# Patient Record
Sex: Female | Born: 1960 | Race: White | Hispanic: No | Smoking: Never smoker
Health system: Southern US, Community
[De-identification: ages and names within clinical notes are randomized; demographics above are authoritative.]

## PROBLEM LIST (undated history)

## (undated) DIAGNOSIS — Z8742 Personal history of other diseases of the female genital tract: Secondary | ICD-10-CM

## (undated) DIAGNOSIS — N946 Dysmenorrhea, unspecified: Secondary | ICD-10-CM

## (undated) DIAGNOSIS — J309 Allergic rhinitis, unspecified: Secondary | ICD-10-CM

## (undated) DIAGNOSIS — R002 Palpitations: Secondary | ICD-10-CM

## (undated) DIAGNOSIS — I10 Essential (primary) hypertension: Secondary | ICD-10-CM

## (undated) DIAGNOSIS — N949 Unspecified condition associated with female genital organs and menstrual cycle: Secondary | ICD-10-CM

## (undated) DIAGNOSIS — D259 Leiomyoma of uterus, unspecified: Secondary | ICD-10-CM

## (undated) DIAGNOSIS — N920 Excessive and frequent menstruation with regular cycle: Secondary | ICD-10-CM

## (undated) DIAGNOSIS — G40909 Epilepsy, unspecified, not intractable, without status epilepticus: Secondary | ICD-10-CM

## (undated) DIAGNOSIS — G43909 Migraine, unspecified, not intractable, without status migrainosus: Secondary | ICD-10-CM

## (undated) HISTORY — PX: SKIN CANCER EXCISION: SHX779

## (undated) HISTORY — PX: HAND SURGERY: SHX662

## (undated) HISTORY — DX: Migraine, unspecified, not intractable, without status migrainosus: G43.909

## (undated) HISTORY — DX: Leiomyoma of uterus, unspecified: D25.9

## (undated) HISTORY — DX: Personal history of other diseases of the female genital tract: Z87.42

## (undated) HISTORY — DX: Epilepsy, unspecified, not intractable, without status epilepticus: G40.909

## (undated) HISTORY — DX: Allergic rhinitis, unspecified: J30.9

## (undated) HISTORY — DX: Palpitations: R00.2

## (undated) HISTORY — DX: Excessive and frequent menstruation with regular cycle: N92.0

## (undated) HISTORY — PX: NO PAST SURGERIES: SHX2092

## (undated) HISTORY — DX: Unspecified condition associated with female genital organs and menstrual cycle: N94.9

## (undated) HISTORY — DX: Essential (primary) hypertension: I10

## (undated) HISTORY — DX: Dysmenorrhea, unspecified: N94.6

---

## 2005-06-17 ENCOUNTER — Ambulatory Visit: Payer: Self-pay | Admitting: Obstetrics and Gynecology

## 2006-08-04 ENCOUNTER — Ambulatory Visit: Payer: Self-pay | Admitting: Obstetrics and Gynecology

## 2007-08-27 ENCOUNTER — Ambulatory Visit: Payer: Self-pay | Admitting: Obstetrics and Gynecology

## 2008-09-20 ENCOUNTER — Ambulatory Visit: Payer: Self-pay | Admitting: Obstetrics and Gynecology

## 2009-09-27 ENCOUNTER — Ambulatory Visit: Payer: Self-pay | Admitting: Obstetrics and Gynecology

## 2011-02-14 ENCOUNTER — Ambulatory Visit: Payer: Self-pay | Admitting: Obstetrics and Gynecology

## 2012-02-28 ENCOUNTER — Ambulatory Visit: Payer: Self-pay | Admitting: Obstetrics and Gynecology

## 2013-03-05 ENCOUNTER — Ambulatory Visit: Payer: Self-pay | Admitting: Cardiovascular Disease

## 2013-03-24 ENCOUNTER — Ambulatory Visit (INDEPENDENT_AMBULATORY_CARE_PROVIDER_SITE_OTHER): Payer: PRIVATE HEALTH INSURANCE | Admitting: Cardiovascular Disease

## 2013-03-24 ENCOUNTER — Encounter: Payer: Self-pay | Admitting: Cardiovascular Disease

## 2013-03-24 VITALS — BP 153/103 | HR 81 | Ht 67.0 in | Wt 168.0 lb

## 2013-03-24 DIAGNOSIS — R0602 Shortness of breath: Secondary | ICD-10-CM

## 2013-03-24 DIAGNOSIS — Z8249 Family history of ischemic heart disease and other diseases of the circulatory system: Secondary | ICD-10-CM

## 2013-03-24 DIAGNOSIS — R079 Chest pain, unspecified: Secondary | ICD-10-CM

## 2013-03-24 DIAGNOSIS — E785 Hyperlipidemia, unspecified: Secondary | ICD-10-CM | POA: Insufficient documentation

## 2013-03-24 NOTE — Assessment & Plan Note (Signed)
Etiology of her episode of chest pain 2 months ago is not clear. EKG is normal. We have offered stress testing including routine treadmill, stress echo, or nuclear stress test. As she feels well and is working out at the gym with no significant symptoms, she would like to hold off on any further testing at this time.  Given her strong family history, she is willing to complete a calcium score which may help direct management of her lipids.

## 2013-03-24 NOTE — Assessment & Plan Note (Signed)
We've asked her to monitor her shortness of breath closely. Certainly this could be from recent weight gain. If symptoms get worse, we've asked her to call our office for further evaluation.

## 2013-03-24 NOTE — Patient Instructions (Addendum)
You are doing well. No medication changes were made.  We will schedule a calcium score in [place]  We will call you with the result  Please call us if you have new issues that need to be addressed before your next appt.

## 2013-03-24 NOTE — Assessment & Plan Note (Addendum)
Father was a long-time smoker with coronary artery disease at an early age, in his 10s

## 2013-03-24 NOTE — Assessment & Plan Note (Signed)
We will try to obtain her most recent lipid panel for our records. She will complete a calcium score/ noncontrast CT of the heart. If her score is elevated, we would treat her aggressively. No other risk factors.

## 2013-03-24 NOTE — Progress Notes (Signed)
Patient ID: Charlotte Watkins, female    DOB: [birth year], 52 y.o.   MRN: [id number]  HPI Comments: 52 ypo woman with PMH of palpitations, HTN, chest pain, migraines, presenting for new patient evaluation for chest pain.  She reports that 2 months ago, she was working out at [employer] when she developed shortness of breath, jaw pain, numbness down her arms. Symptoms lasted for several hours. She went home and lay down on the floor at home for quite some time and eventually symptoms resolved. She was very scared, looked up symptoms for heart attack on the Internet. Since then she has not had any further symptoms apart from some mild shortness of breath with exertion. She is uncertain if this is from being sedentary and recent weight gain. She has been back to the gym and has been working out with no reproducible chest pain. Overall now she feels close to her baseline apart from mild shortness of breath at times.  She has been seen before at [facility] for chest pain though reports nothing has been done for her symptoms.  She was told that her symptoms were from weight gain and PVCs. Holter monitor has been done in the past but is not available for [place] today. She denies ever having other cardiac testing such as echocardiogram or stress test.  Her biggest fear is family history. Father was a long-time smoker, had MI in his late 72s, bypass surgery, eventually required heart transplant She reports latest cholesterol was done by Dr. Greggory Keen, GYN  EKG today shows normal sinus rhythm with rate 77 beats per minute, poor R-wave progression through the anterior precordial leads   Outpatient Encounter Prescriptions as of 03/24/2013  Medication Sig Dispense Refill  . Calcium Carbonate-Vitamin D (CALCIUM + D PO) Take by mouth daily.      Marland Kitchen eletriptan (RELPAX) 40 MG tablet One tablet by mouth at onset of headache. May repeat in 2 hours if headache persists or recurs. may repeat in 2 hours if necessary       . Multiple Vitamin (MULTIVITAMIN) tablet Take 1 tablet by mouth daily.         Review of Systems  Constitutional: Negative.   HENT: Negative.   Eyes: Negative.   Respiratory: Positive for shortness of breath.   Cardiovascular: Positive for chest pain.  Gastrointestinal: Negative.   Musculoskeletal: Negative.   Skin: Negative.   Neurological: Negative.   Psychiatric/Behavioral: Negative.   All other systems reviewed and are negative.     BP 153/103  Pulse 81  Ht 5\' 7"  (1.702 m)  Wt 168 lb (76.204 kg)  BMI 26.31 kg/m2  Physical Exam  Nursing note and vitals reviewed. Constitutional: She is oriented to person, place, and time. She appears well-developed and well-nourished.  HENT:  Head: Normocephalic.  Nose: Nose normal.  Mouth/Throat: Oropharynx is clear and moist.  Eyes: Conjunctivae are normal. Pupils are equal, round, and reactive to light.  Neck: Normal range of motion. Neck supple. No JVD present.  Cardiovascular: Normal rate, regular rhythm, S1 normal, S2 normal, normal heart sounds and intact distal pulses.  Exam reveals no gallop and no friction rub.   No murmur heard. Pulmonary/Chest: Effort normal and breath sounds normal. No respiratory distress. She has no wheezes. She has no rales. She exhibits no tenderness.  Abdominal: Soft. Bowel sounds are normal. She exhibits no distension. There is no tenderness.  Musculoskeletal: Normal range of motion. She exhibits no edema and no tenderness.  Lymphadenopathy:  She has no cervical adenopathy.  Neurological: She is alert and oriented to person, place, and time. Coordination normal.  Skin: Skin is warm and dry. No rash noted. No erythema.  Psychiatric: She has a normal mood and affect. Her behavior is normal. Judgment and thought content normal.    Assessment and Plan

## 2013-03-25 ENCOUNTER — Telehealth: Payer: Self-pay

## 2013-03-25 NOTE — Telephone Encounter (Signed)
Scheduled pt for CT calcium score in [place] office 6/23 at 1030 Pt informed She is aware she needs to pay $150 at time of appt

## 2013-03-25 NOTE — Addendum Note (Signed)
Addended by: Sabino Snipes E on: 03/25/2013 08:57 AM   Modules accepted: Orders

## 2013-03-29 ENCOUNTER — Ambulatory Visit (INDEPENDENT_AMBULATORY_CARE_PROVIDER_SITE_OTHER)
Admission: RE | Admit: 2013-03-29 | Discharge: 2013-03-29 | Disposition: A | Discharge status: Home / Self Care | Payer: PRIVATE HEALTH INSURANCE | Source: Ambulatory Visit | Attending: Cardiovascular Disease | Admitting: Cardiovascular Disease

## 2013-03-29 DIAGNOSIS — R0602 Shortness of breath: Secondary | ICD-10-CM

## 2013-03-29 DIAGNOSIS — R079 Chest pain, unspecified: Secondary | ICD-10-CM

## 2013-03-29 DIAGNOSIS — Z8249 Family history of ischemic heart disease and other diseases of the circulatory system: Secondary | ICD-10-CM

## 2013-03-29 DIAGNOSIS — E785 Hyperlipidemia, unspecified: Secondary | ICD-10-CM

## 2013-04-15 ENCOUNTER — Telehealth: Payer: Self-pay | Admitting: *Deleted

## 2013-04-15 NOTE — Telephone Encounter (Signed)
Patient notified of calcium score is 0 which is normal.

## 2013-04-15 NOTE — Telephone Encounter (Signed)
Patient called wanting the results of calcium score

## 2013-04-15 NOTE — Telephone Encounter (Signed)
Normal Score=0

## 2013-09-17 ENCOUNTER — Ambulatory Visit: Payer: Self-pay | Admitting: Obstetrics and Gynecology

## 2014-03-16 ENCOUNTER — Ambulatory Visit (INDEPENDENT_AMBULATORY_CARE_PROVIDER_SITE_OTHER): Payer: PRIVATE HEALTH INSURANCE | Admitting: Family Medicine

## 2014-03-16 ENCOUNTER — Encounter: Payer: Self-pay | Admitting: Family Medicine

## 2014-03-16 ENCOUNTER — Other Ambulatory Visit (INDEPENDENT_AMBULATORY_CARE_PROVIDER_SITE_OTHER): Payer: PRIVATE HEALTH INSURANCE

## 2014-03-16 VITALS — BP 128/82 | HR 77 | Ht 67.0 in | Wt 176.0 lb

## 2014-03-16 DIAGNOSIS — IMO0002 Reserved for concepts with insufficient information to code with codable children: Secondary | ICD-10-CM

## 2014-03-16 DIAGNOSIS — M25569 Pain in unspecified knee: Secondary | ICD-10-CM

## 2014-03-16 DIAGNOSIS — M25562 Pain in left knee: Secondary | ICD-10-CM

## 2014-03-16 DIAGNOSIS — S83207A Unspecified tear of unspecified meniscus, current injury, left knee, initial encounter: Secondary | ICD-10-CM

## 2014-03-16 DIAGNOSIS — S83209A Unspecified tear of unspecified meniscus, current injury, unspecified knee, initial encounter: Secondary | ICD-10-CM | POA: Insufficient documentation

## 2014-03-16 MED ORDER — MELOXICAM 15 MG PO TABS
15.0000 mg | ORAL_TABLET | Freq: Every day | ORAL | Status: DC
Start: 2014-03-16 — End: 2015-10-18

## 2014-03-16 NOTE — Progress Notes (Signed)
  [name] [address], [place] [address] Phone: [phone] Subjective:     CC: Left knee pain  [id number] Charlotte Watkins is a 53 y.o. female coming in with complaint of left knee pain. Patient states she has had this pain for several months. Patient does not remember any specific injury. Patient is Thinking that it would get better. Unfortunately the pain never did resolve. Patient states it hurts significantly with a sharp sensation with his turning motion as well as going up or down stairs. Patient denies any radiation to the legs or any weakness. Patient denies any nighttime awakening. Patient has tried Tylenol and Advil over-the-counter with minimal improvement. Patient rates the severity of 5/10 because it seems to be more of a constant pain now and is affecting her regular workout.     Past medical history, social, surgical and family history all reviewed in electronic medical record.   Review of Systems: No headache, visual changes, nausea, vomiting, diarrhea, constipation, dizziness, abdominal pain, skin rash, fevers, chills, night sweats, weight loss, swollen lymph nodes, body aches, joint swelling, muscle aches, chest pain, shortness of breath, mood changes.   Objective Blood pressure 128/82, pulse 77, height 5\' 7"  (1.702 m), weight 176 lb (79.833 kg), SpO2 98.00%.  General: No apparent distress alert and oriented x3 mood and affect normal, dressed appropriately.  HEENT: Pupils equal, extraocular movements intact  Respiratory: Patient's speak in full sentences and does not appear short of breath  Cardiovascular: No lower extremity edema, non tender, no erythema  Skin: Warm dry intact with no signs of infection or rash on extremities or on axial skeleton.  Abdomen: Soft nontender  Neuro: Cranial nerves II through XII are intact, neurovascularly intact in all extremities with 2+ DTRs and 2+ pulses.  Lymph: No lymphadenopathy of  posterior or anterior cervical chain or axillae bilaterally.  Gait normal with good balance and coordination.  MSK:  Non tender with full range of motion and good stability and symmetric strength and tone of shoulders, elbows, wrist, hip, and ankles bilaterally.  Knee: Left Normal to inspection with no erythema or effusion or obvious bony abnormalities. Palpation reveals tenderness to palpation over the medial joint line. ROM full in flexion and extension and lower leg rotation. Ligaments with solid consistent endpoints including ACL, PCL, LCL, MCL. Positive Mcmurray's, Apley's, and Thessalonian tests. Non painful patellar compression. Patellar glide with minimal crepitus. Patellar and quadriceps tendons unremarkable. Hamstring and quadriceps strength is normal.   MSK US performed of: Left knee This study was ordered, performed, and interpreted by Charlann Boxer D.O.  Knee: All structures visualized. Trace effusion noted in the suprapatellar pouch Anteromedial, anterolateral, and posterolateral menisci unremarkable without tearing, fraying, effusion, or displacement. Patient though does have significant displacement and a very large tear of the posterior medial meniscus. Patellar Tendon unremarkable on long and transverse views without effusion. No abnormality of prepatellar bursa. LCL and MCL unremarkable on long and transverse views. No abnormality of origin of medial or lateral head of the gastrocnemius.  IMPRESSION:  Large posterior medial meniscal tear    Impression and Recommendations:     This case required medical decision making of moderate complexity.

## 2014-03-16 NOTE — Assessment & Plan Note (Signed)
Patient was given an injection today. In addition this patient was given a brace was fitted by me today. We discussed exercises on a regular basis and to avoid any jumping or twisting sensations or deep squat. We discussed icing protocol will be beneficial and was given a prescriptive for anti-inflammatories as needed. Patient will followup again in 3 weeks for further evaluation. Patient continues to have pain imaging studies may be necessary.

## 2014-03-16 NOTE — Patient Instructions (Signed)
Very nice to meet you You have a meniscal tear Exercises 3 times a week Ice 20 minutes after activity and before bed Wear brace when doing a lot of activity or exercise.  Meloxicam when you need it.  No squatting greater then 65 degrees.  Come back in 3 weeks.

## 2014-04-06 ENCOUNTER — Ambulatory Visit: Payer: PRIVATE HEALTH INSURANCE | Admitting: Family Medicine

## 2014-04-07 ENCOUNTER — Ambulatory Visit (INDEPENDENT_AMBULATORY_CARE_PROVIDER_SITE_OTHER): Payer: PRIVATE HEALTH INSURANCE | Admitting: Family Medicine

## 2014-04-07 ENCOUNTER — Other Ambulatory Visit (INDEPENDENT_AMBULATORY_CARE_PROVIDER_SITE_OTHER): Payer: PRIVATE HEALTH INSURANCE

## 2014-04-07 ENCOUNTER — Encounter: Payer: Self-pay | Admitting: Family Medicine

## 2014-04-07 VITALS — BP 134/82 | HR 72 | Ht 64.0 in | Wt 174.0 lb

## 2014-04-07 DIAGNOSIS — S83207D Unspecified tear of unspecified meniscus, current injury, left knee, subsequent encounter: Secondary | ICD-10-CM

## 2014-04-07 DIAGNOSIS — Z5189 Encounter for other specified aftercare: Secondary | ICD-10-CM

## 2014-04-07 NOTE — Assessment & Plan Note (Addendum)
Patient overall Patient will continue with the exercises 3 times a week. Icing continue Progress to more strengthening exercises.  Patient will follow up again in 3-4 weeks before her large trip in hiking to make sure she continues to do well.

## 2014-04-07 NOTE — Patient Instructions (Addendum)
It is good to see you Ice 20 minutes 2 times daily.  Continue the exercises 3 times a week for the next 6 weeks.  Start squatting at 30 degrees and increase 10 degrees a week.  Wear brace with activity  See me again in 4 weeks.

## 2014-04-07 NOTE — Progress Notes (Signed)
  [name] [address] [place], [place] [address] Phone: [phone] Subjective:     CC: Left knee pain followup  [id number] Charlotte Watkins is a 53 y.o. female coming in with complaint of left knee pain. Patient was seen previously and did have a large meniscal tear most of the posterior meniscus. Patient was seen previously and the patient was given an injection. Patient was also given a brace, home exercises, icing protocol and anti-inflammatories. Patient states she is approximately 75% better. Patient continues to wear the brace when she does certain activities. Patient is still with deep squatting or any twisting motion she can have some discomfort. Patient does state that she's able to do all activities of daily living and has not having any difficulty at night. Overall patient is very happy with the results.     Past medical history, social, surgical and family history all reviewed in electronic medical record.   Review of Systems: No headache, visual changes, nausea, vomiting, diarrhea, constipation, dizziness, abdominal pain, skin rash, fevers, chills, night sweats, weight loss, swollen lymph nodes, body aches, joint swelling, muscle aches, chest pain, shortness of breath, mood changes.   Objective Blood pressure 134/82, pulse 72, height 5\' 4"  (1.626 m), weight 174 lb (78.926 kg), SpO2 97.00%.  General: No apparent distress alert and oriented x3 mood and affect normal, dressed appropriately.  HEENT: Pupils equal, extraocular movements intact  Respiratory: Patient's speak in full sentences and does not appear short of breath  Cardiovascular: No lower extremity edema, non tender, no erythema  Skin: Warm dry intact with no signs of infection or rash on extremities or on axial skeleton.  Abdomen: Soft nontender  Neuro: Cranial nerves II through XII are intact, neurovascularly intact in all extremities with 2+ DTRs and 2+ pulses.  Lymph: No  lymphadenopathy of posterior or anterior cervical chain or axillae bilaterally.  Gait normal with good balance and coordination.  MSK:  Non tender with full range of motion and good stability and symmetric strength and tone of shoulders, elbows, wrist, hip, and ankles bilaterally.  Knee: Left Normal to inspection with no erythema or effusion or obvious bony abnormalities. Palpation shows decreased tenderness over the medial joint line. ROM full in flexion and extension and lower leg rotation. Ligaments with solid consistent endpoints including ACL, PCL, LCL, MCL. Positive Mcmurray's, Apley's, and Thessalonian tests still present. Non painful patellar compression. Patellar glide with minimal crepitus. Patellar and quadriceps tendons unremarkable. Hamstring and quadriceps strength is normal.   MSK US performed of: Left knee This study was ordered, performed, and interpreted by Charlann Boxer D.O.  Knee: All structures visualized. Trace effusion noted in the suprapatellar pouch Anteromedial, anterolateral, and posterolateral menisci unremarkable without tearing, fraying, effusion, or displacement. Patient's posterior medial meniscal tear appears to be healing but it is still significantly displaced. Patellar Tendon unremarkable on long and transverse views without effusion. No abnormality of prepatellar bursa. LCL and MCL unremarkable on long and transverse views. No abnormality of origin of medial or lateral head of the gastrocnemius.  IMPRESSION:  Large posterior medial meniscal tear healing but continued displacement.    Impression and Recommendations:     This case required medical decision making of moderate complexity.

## 2014-05-05 ENCOUNTER — Ambulatory Visit (INDEPENDENT_AMBULATORY_CARE_PROVIDER_SITE_OTHER): Payer: PRIVATE HEALTH INSURANCE | Admitting: Family Medicine

## 2014-05-05 ENCOUNTER — Encounter: Payer: Self-pay | Admitting: Family Medicine

## 2014-05-05 VITALS — BP 140/100 | HR 95 | Temp 98.2°F | Wt 178.0 lb

## 2014-05-05 DIAGNOSIS — Z5189 Encounter for other specified aftercare: Secondary | ICD-10-CM

## 2014-05-05 DIAGNOSIS — S83207D Unspecified tear of unspecified meniscus, current injury, left knee, subsequent encounter: Secondary | ICD-10-CM

## 2014-05-05 NOTE — Progress Notes (Signed)
Pre visit review using our clinic review tool, if applicable. No additional management support is needed unless otherwise documented below in the visit note. 

## 2014-05-05 NOTE — Patient Instructions (Signed)
Good to see you Ice 20 minutes 2 times daily. Usually after activity and before bed. Continue the exercises. OK to squat.  Start a walk-run progression: Only 2 times a week for next 3 weeks.   - Initially start one minute walking than one minute running for 20 mins in the first week,   then 25 mins during the second week, then 30 mins afterwards.  Once you have reached 30 mins: - Run 2 mins, then walk 1 min. -Then run 3 mins, and walk 1 min. -Then run 4 mins, and walk 1 min. -Then run 5 mins, and walk 1 min. -Slowly build up weekly to running 30 mins nonstop.  If painful at any of the steps, back up one step. Come back in 3-4 weeks.

## 2014-05-05 NOTE — Assessment & Plan Note (Signed)
Overall doing well with conservative therapy.  She'll continue home exercises, bracing, as well as icing protocol. Patient was given a running progression will start to slowly over the course of the next 3-4 weeks. Patient still has a trip planned for [place] in September and would like to be able to hike without pain. Patient was back in 3-4 weeks and continues to have discomfort we may get an MRI to further evaluate and patient may benefit from another steroid injection before her trip. We'll discuss further at followup.

## 2014-05-05 NOTE — Progress Notes (Signed)
  [name] [place] [place] [address], [place] [address] Phone: [phone] Subjective:     CC: Left knee pain followup  [id number] Charlotte Watkins is a 53 y.o. female coming in with complaint of left knee pain. Patient was seen previously and did have a large meniscal tear most of the posterior meniscus.  Continues to improve.  85-90% better. No clicking or popping.  Continues to be able to do more activity but has not started running.      Past medical history, social, surgical and family history all reviewed in electronic medical record.   Review of Systems: No headache, visual changes, nausea, vomiting, diarrhea, constipation, dizziness, abdominal pain, skin rash, fevers, chills, night sweats, weight loss, swollen lymph nodes, body aches, joint swelling, muscle aches, chest pain, shortness of breath, mood changes.   Objective Blood pressure 140/100, pulse 95, temperature 98.2 F (36.8 C), temperature source Oral, weight 178 lb (80.74 kg), SpO2 97.00%.  General: No apparent distress alert and oriented x3 mood and affect normal, dressed appropriately.  HEENT: Pupils equal, extraocular movements intact  Respiratory: Patient's speak in full sentences and does not appear short of breath  Cardiovascular: No lower extremity edema, non tender, no erythema  Skin: Warm dry intact with no signs of infection or rash on extremities or on axial skeleton.  Abdomen: Soft nontender  Neuro: Cranial nerves II through XII are intact, neurovascularly intact in all extremities with 2+ DTRs and 2+ pulses.  Lymph: No lymphadenopathy of posterior or anterior cervical chain or axillae bilaterally.  Gait normal with good balance and coordination.  MSK:  Non tender with full range of motion and good stability and symmetric strength and tone of shoulders, elbows, wrist, hip, and ankles bilaterally.  Knee: Left Normal to inspection with no erythema or effusion or obvious bony  abnormalities. Palpation shows decreased tenderness over the medial joint line. ROM full in flexion and extension and lower leg rotation. Ligaments with solid consistent endpoints including ACL, PCL, LCL, MCL. Negative Mcmurray's, Apley's, and Thessalonian tests Non painful patellar compression. Patellar glide with minimal crepitus. Patellar and quadriceps tendons unremarkable. Hamstring and quadriceps strength is normal.   MSK US performed of: Left knee This study was ordered, performed, and interpreted by Charlann Boxer D.O.  Knee: All structures visualized. Trace effusion noted in the suprapatellar pouch Anteromedial, anterolateral, and posterolateral menisci unremarkable without tearing, fraying, effusion, or displacement. Patient's posterior medial meniscal tear appears better overall, less displaced no swelling noted.  Patellar Tendon unremarkable on long and transverse views without effusion. No abnormality of prepatellar bursa. LCL and MCL unremarkable on long and transverse views. No abnormality of origin of medial or lateral head of the gastrocnemius.  IMPRESSION:  Large posterior medial meniscal tear healing.    Impression and Recommendations:     This case required medical decision making of moderate complexity.

## 2014-06-02 ENCOUNTER — Ambulatory Visit: Payer: PRIVATE HEALTH INSURANCE | Admitting: Family Medicine

## 2014-06-06 ENCOUNTER — Emergency Department: Payer: Self-pay | Admitting: Emergency Medicine

## 2014-06-08 ENCOUNTER — Ambulatory Visit: Payer: PRIVATE HEALTH INSURANCE | Admitting: Family Medicine

## 2014-06-15 ENCOUNTER — Ambulatory Visit (INDEPENDENT_AMBULATORY_CARE_PROVIDER_SITE_OTHER): Payer: PRIVATE HEALTH INSURANCE | Admitting: Family Medicine

## 2014-06-15 ENCOUNTER — Encounter: Payer: Self-pay | Admitting: Family Medicine

## 2014-06-15 VITALS — BP 118/76 | HR 79 | Ht 64.0 in | Wt 176.0 lb

## 2014-06-15 DIAGNOSIS — S83207D Unspecified tear of unspecified meniscus, current injury, left knee, subsequent encounter: Secondary | ICD-10-CM

## 2014-06-15 DIAGNOSIS — S63509A Unspecified sprain of unspecified wrist, initial encounter: Secondary | ICD-10-CM | POA: Insufficient documentation

## 2014-06-15 DIAGNOSIS — Z5189 Encounter for other specified aftercare: Secondary | ICD-10-CM

## 2014-06-15 NOTE — Progress Notes (Signed)
[name] [address], [place] [address] Phone: [phone] Subjective:     CC: Left knee pain followup  QJJ:Charlotte Watkins Charlotte Watkins is a 53 y.o. female coming in with complaint of left knee pain.  Patient did have a large meniscal tear previously. Patient states that the knee is not giving her any pain at all anymore. Patient states that she has not been able to increase her activity because of recent fall. Patient fell and unfortunately lost consciousness after hitting the ground. Patient was seen by emergency doctor as well as followup with an orthopedic surgeon. Patient states that there are no fractures. Patient is having some mild wrist pain bilaterally but did see another provider for that.     Past medical history, social, surgical and family history all reviewed in electronic medical record.   Review of Systems: No headache, visual changes, nausea, vomiting, diarrhea, constipation, dizziness, abdominal pain, skin rash, fevers, chills, night sweats, weight loss, swollen lymph nodes, body aches, joint swelling, muscle aches, chest pain, shortness of breath, mood changes.   Objective Blood pressure 118/76, pulse 79, height 5\' 4"  (1.626 m), weight 176 lb (79.833 kg), SpO2 98.00%.  General: No apparent distress alert and oriented x3 mood and affect normal, dressed appropriately. Patient does have healing abrasions on her nose and face. HEENT: Pupils equal, extraocular movements intact  Respiratory: Patient's speak in full sentences and does not appear short of breath  Cardiovascular: No lower extremity edema, non tender, no erythema  Skin: Warm dry intact with no signs of infection or rash on extremities or on axial skeleton.  Abdomen: Soft nontender  Neuro: Cranial nerves II through XII are intact, neurovascularly intact in all extremities with 2+ DTRs and 2+ pulses.  Lymph: No lymphadenopathy of posterior or anterior cervical chain or  axillae bilaterally.  Gait normal with good balance and coordination.  MSK:  Non tender with full range of motion and good stability and symmetric strength and tone of shoulders, elbows,  hip, and ankles bilaterally.  Knee: Left Normal to inspection with no erythema or effusion or obvious bony abnormalities. Palpation shows no tenderness to palpation. ROM full in flexion and extension and lower leg rotation. Ligaments with solid consistent endpoints including ACL, PCL, LCL, MCL. Negative Mcmurray's, Apley's, and Thessalonian tests  Non painful patellar compression. Patellar glide with minimal crepitus. Patellar and quadriceps tendons unremarkable. Hamstring and quadriceps strength is normal.  Wrist: Bilateral Inspection normal with no visible erythema or swelling. Mild decrease in range of motion of flexion extension as well as supination and pronation secondary to pain but full passive range of motion if needed. Palpation is normal over metacarpals, navicular, lunate, and TFCC; tendons with minimal generalized tenderness No snuffbox tenderness. No tenderness over Canal of [place]. Strength 5/5 in all directions without pain. Negative Finkelstein, tinel's and phalens. Negative Watson's test.   MSK US performed of: Left knee This study was ordered, performed, and interpreted by Charlann Boxer D.O.  Knee: All structures visualized. Trace effusion noted in the suprapatellar pouch Anteromedial, anterolateral, and posterolateral menisci unremarkable without tearing, fraying, effusion, or displacement. Patient's posterior medial meniscal tear a tears to be healed with no displacement. Mild chronic changes noted.  Patellar Tendon unremarkable on long and transverse views without effusion. No abnormality of prepatellar bursa. LCL and MCL unremarkable on long and transverse views. No abnormality of origin of medial or lateral head of the gastrocnemius.  IMPRESSION: Healed posterior medial  meniscal tear.  Impression and Recommendations:     This case required medical decision making of moderate complexity.

## 2014-06-15 NOTE — Assessment & Plan Note (Signed)
After fall at this time. Patient was given home exercise program and she'll proper technique. We discussed an icing regimen. Patient knows that if she is not completely pain-free in 2-3 weeks when she returns from her vacation patient will come back again for further evaluation.

## 2014-06-15 NOTE — Assessment & Plan Note (Signed)
Patient is now 3 months after injection and continues with conservative therapy. Patient is doing significantly well at this time with no medications and is only bracing occasionally. Patient does have a hiking trip coming up so we are going to wear the brace during that time. Continue icing for any type of discomfort the patient is likely going to be fine at this time. Patient is cleared to do any type of activity. Patient will come back on an as-needed basis.

## 2014-06-15 NOTE — Patient Instructions (Signed)
Good to see you i am sorry for the fall New exercises for the wrist 3 times a week Ice for your knee or wrist Wear the knee brace when hiking.  See me when you need me.

## 2014-10-18 ENCOUNTER — Ambulatory Visit: Payer: Self-pay | Admitting: Obstetrics and Gynecology

## 2015-10-18 ENCOUNTER — Ambulatory Visit (INDEPENDENT_AMBULATORY_CARE_PROVIDER_SITE_OTHER): Payer: No Typology Code available for payment source | Admitting: Obstetrics and Gynecology

## 2015-10-18 ENCOUNTER — Encounter: Payer: Self-pay | Admitting: Obstetrics and Gynecology

## 2015-10-18 VITALS — BP 135/83 | HR 80 | Ht 67.0 in | Wt 175.8 lb

## 2015-10-18 DIAGNOSIS — N761 Subacute and chronic vaginitis: Secondary | ICD-10-CM | POA: Diagnosis not present

## 2015-10-18 DIAGNOSIS — N898 Other specified noninflammatory disorders of vagina: Secondary | ICD-10-CM | POA: Diagnosis not present

## 2015-10-18 MED ORDER — METRONIDAZOLE 500 MG PO TABS: 500.0000 mg | ORAL_TABLET | Freq: Four times a day (QID) | ORAL | Status: DC

## 2015-10-18 MED ORDER — AZITHROMYCIN 1 G PO PACK: 1.0000 g | PACK | Freq: Once | ORAL | Status: DC

## 2015-10-18 NOTE — Patient Instructions (Signed)
1.  Flagyl 2 g orally for one dose is written. 2.  Zithromax 1 g orally for one dose is written. 3.  If vaginal discharge does not resolve within 7-10 days, return for probable pelvic ultrasound and endometrial biopsy

## 2015-10-18 NOTE — Progress Notes (Signed)
Chief complaint: 1.  Vaginitis.  Charlotte Watkins presents today for evaluation of persistent vaginitis, which has not resolved with over-the-counter antifungal therapies.  She has not in on any recent antibiotics.  She has not had a new sexual partner in over 18 months.  She denies fevers, chills, sweats, nausea, vomiting, diarrhea.  Past history, past surgical history, problem list, medications, and allergies are reviewed.  Review of systems: Per HPI.  OBJECTIVE: BP 135/83 mmHg  Pulse 80  Ht 5\' 7"  (1.702 m)  Wt 175 lb 12.5 oz (79.733 kg)  BMI 27.52 kg/m2  LMP 03/05/2013 Pleasant, well-appearing white female in no acute distress.  She is alert and oriented. Abdomen: Soft, nontender without organomegaly. Pelvic exam: External genitalia-normal BUS-normal Vagina-yellow purulent discharge present; minimal malodor. Cervix-no lesions; no cervical motion tenderness. Bimanual exam-nontender. Rectovaginal-normal.  External exam, Skin: No rash.  PROCEDURE: Wet prep. Normal saline-TNTC, white blood cells; no trichomonas; no clue cells. KOH-no hyphae.  ASSESSMENT: 1.  Chronic vaginitis, unclear etiology, no recent STD exposure.  PLAN: 1.  Wet prep 2.  Flagyl 2 g by mouth times one dose. 3.  Zithromax 1 g by mouth 1 dose 4.  If discharge does not resolve in 7-10 days, patient is to return for pelvic ultrasound and probable endometrial biopsy to rule out intrauterine pathology.  A total of 15 minutes were spent face-to-face with the patient during this encounter and over half of that time dealt with counseling and coordination of care.  Brayton Mars, MD  Note: This dictation was prepared with Dragon dictation along with smaller phrase technology. Any transcriptional errors that result from this process are unintentional.

## 2017-02-06 DIAGNOSIS — Z1239 Encounter for other screening for malignant neoplasm of breast: Secondary | ICD-10-CM | POA: Insufficient documentation

## 2017-02-06 NOTE — Progress Notes (Deleted)
ANNUAL PREVENTATIVE CARE GYN  ENCOUNTER NOTE  Subjective:       Charlotte Watkins is a 56 y.o. No obstetric history on file. female here for a routine annual gynecologic exam.  Current complaints: 1.      Gynecologic History Patient's last menstrual period was 03/05/2013. Contraception: post menopausal status Last Pap: ?Marland Kitchen Results were: normal Last mammogram: 10/2014 birad 1. Results were: normal  Obstetric History OB History  No data available    Past Medical History:  Diagnosis Date  . Allergic rhinitis, cause unspecified   . Dysmenorrhea   . Excessive or frequent menstruation   . Hypertension   . Leiomyoma of uterus, unspecified   . Migraine, unspecified, without mention of intractable migraine without mention of status migrainosus   . Palpitations   . Personal history of other genital system and obstetric disorders(V13.29)   . Seizure disorder ([place])   . Unspecified epilepsy without mention of intractable epilepsy ([place])   . Unspecified epilepsy without mention of intractable epilepsy ([place])   . Unspecified symptom associated with female genital organs     Past Surgical History:  Procedure Laterality Date  . HAND SURGERY    . SKIN CANCER EXCISION      Current Outpatient Prescriptions on File Prior to Visit  Medication Sig Dispense Refill  . azithromycin (ZITHROMAX) 1 g powder Take 1 packet (1 g total) by mouth once. 1 each 0  . Calcium Carbonate-Vitamin D (CALCIUM + D PO) Take by mouth daily.    Marland Kitchen eletriptan (RELPAX) 40 MG tablet One tablet by mouth at onset of headache. May repeat in 2 hours if headache persists or recurs. may repeat in 2 hours if necessary    . metroNIDAZOLE (FLAGYL) 500 MG tablet Take 1 tablet (500 mg total) by mouth 4 (four) times daily. 2 GM PO X 1 4 tablet 0  . Multiple Vitamin (MULTIVITAMIN) tablet Take 1 tablet by mouth daily.     No current facility-administered medications on file prior to visit.     Allergies  Allergen Reactions  .  Dilantin [Phenytoin Sodium Extended]     Social History   Social History  . Marital status: Divorced    Spouse name: N/A  . Number of children: N/A  . Years of education: N/A   Occupational History  . Not on file.   Social History Main Topics  . Smoking status: Never Smoker  . Smokeless tobacco: Not on file  . Alcohol use 1.8 oz/week    3 Glasses of wine per week  . Drug use: No  . Sexual activity: Not Currently    Birth control/ protection: Post-menopausal   Other Topics Concern  . Not on file   Social History Narrative  . No narrative on file    Family History  Problem Relation Age of Onset  . Heart disease Father   . Heart attack Father     The following portions of the patient's history were reviewed and updated as appropriate: allergies, current medications, past family history, past medical history, past social history, past surgical history and problem list.  Review of Systems ROS Review of Systems - General ROS: negative for - chills, fatigue, fever, hot flashes, night sweats, weight gain or weight loss Psychological ROS: negative for - anxiety, decreased libido, depression, mood swings, physical abuse or sexual abuse Ophthalmic ROS: negative for - blurry vision, eye pain or loss of vision ENT ROS: negative for - headaches, hearing change, visual changes or vocal changes  Allergy and Immunology ROS: negative for - hives, itchy/watery eyes or seasonal allergies Hematological and Lymphatic ROS: negative for - bleeding problems, bruising, swollen lymph nodes or weight loss Endocrine ROS: negative for - galactorrhea, hair pattern changes, hot flashes, malaise/lethargy, mood swings, palpitations, polydipsia/polyuria, skin changes, temperature intolerance or unexpected weight changes Breast ROS: negative for - new or changing breast lumps or nipple discharge Respiratory ROS: negative for - cough or shortness of breath Cardiovascular ROS: negative for - chest pain,  irregular heartbeat, palpitations or shortness of breath Gastrointestinal ROS: no abdominal pain, change in bowel habits, or black or bloody stools Genito-Urinary ROS: no dysuria, trouble voiding, or hematuria Musculoskeletal ROS: negative for - joint pain or joint stiffness Neurological ROS: negative for - bowel and bladder control changes Dermatological ROS: negative for rash and skin lesion changes   Objective:   LMP 03/05/2013  CONSTITUTIONAL: Well-developed, well-nourished female in no acute distress.  PSYCHIATRIC: Normal mood and affect. Normal behavior. Normal judgment and thought content. [place]: Alert and oriented to person, place, and time. Normal muscle tone coordination. No cranial nerve deficit noted. HENT:  Normocephalic, atraumatic, External right and left ear normal. Oropharynx is clear and moist EYES: Conjunctivae and EOM are normal. Pupils are equal, round, and reactive to light. No scleral icterus.  NECK: Normal range of motion, supple, no masses.  Normal thyroid.  SKIN: Skin is warm and dry. No rash noted. Not diaphoretic. No erythema. No pallor. CARDIOVASCULAR: Normal heart rate noted, regular rhythm, no murmur. RESPIRATORY: Clear to auscultation bilaterally. Effort and breath sounds normal, no problems with respiration noted. BREASTS: Symmetric in size. No masses, skin changes, nipple drainage, or lymphadenopathy. ABDOMEN: Soft, normal bowel sounds, no distention noted.  No tenderness, rebound or guarding.  BLADDER: Normal PELVIC:  External Genitalia: Normal  BUS: Normal  Vagina: Normal  Cervix: Normal  Uterus: Normal  Adnexa: Normal  RV: {Blank multiple:19196::"External Exam NormaI","No Rectal Masses","Normal Sphincter tone"}  MUSCULOSKELETAL: Normal range of motion. No tenderness.  No cyanosis, clubbing, or edema.  2+ distal pulses. LYMPHATIC: No Axillary, Supraclavicular, or Inguinal Adenopathy.    Assessment:   Annual gynecologic examination 56  y.o. Contraception: post menopausal status bmi- Problem List Items Addressed This Visit    Screening for breast cancer    Other Visit Diagnoses    Well woman exam with routine gynecological exam    -  Primary   Screen for colon cancer          Plan:  Pap: Pap Co Test Mammogram: Ordered Stool Guaiac Testing:  Ordered Labs: ? Routine preventative health maintenance measures emphasized: {Blank multiple:19196::"Exercise/Diet/Weight control","Tobacco Warnings","Alcohol/Substance use risks","Stress Management","Peer Pressure Issues","Safe Sex"} *** Return to Clinic - [place], [place]

## 2017-02-13 ENCOUNTER — Encounter: Payer: No Typology Code available for payment source | Admitting: Obstetrics and Gynecology

## 2017-02-27 ENCOUNTER — Ambulatory Visit (INDEPENDENT_AMBULATORY_CARE_PROVIDER_SITE_OTHER): Payer: No Typology Code available for payment source | Admitting: Obstetrics and Gynecology

## 2017-02-27 ENCOUNTER — Encounter: Payer: Self-pay | Admitting: Obstetrics and Gynecology

## 2017-02-27 VITALS — BP 124/78 | HR 82 | Ht 67.0 in | Wt 176.2 lb

## 2017-02-27 DIAGNOSIS — Z1231 Encounter for screening mammogram for malignant neoplasm of breast: Secondary | ICD-10-CM | POA: Diagnosis not present

## 2017-02-27 DIAGNOSIS — Z1239 Encounter for other screening for malignant neoplasm of breast: Secondary | ICD-10-CM

## 2017-02-27 DIAGNOSIS — Z01419 Encounter for gynecological examination (general) (routine) without abnormal findings: Secondary | ICD-10-CM

## 2017-02-27 DIAGNOSIS — N952 Postmenopausal atrophic vaginitis: Secondary | ICD-10-CM

## 2017-02-27 DIAGNOSIS — Z1211 Encounter for screening for malignant neoplasm of colon: Secondary | ICD-10-CM | POA: Diagnosis not present

## 2017-02-27 DIAGNOSIS — N951 Menopausal and female climacteric states: Secondary | ICD-10-CM | POA: Diagnosis not present

## 2017-02-27 MED ORDER — CONJ ESTROG-MEDROXYPROGEST ACE 0.625-2.5 MG PO TABS: 1.0000 | ORAL_TABLET | Freq: Every day | ORAL | 1 refills | Status: DC

## 2017-02-27 NOTE — Patient Instructions (Addendum)
1. Pap smear is done. 2. Mammogram is ordered 3. Stool guaiac cards are given for colon cancer screening 4. Screening labs are ordered. 5. Start Prempro 2.5 mg daily for HRT 6. Return in 6 weeks for follow-up on HRT 7. Continue with calcium and vitamin D supplementation twice a day 8. Return in 1 year for annual exam  Health Maintenance, Female Adopting a healthy lifestyle and getting preventive care can go a long way to promote health and wellness. Talk with your health care provider about what schedule of regular examinations is right for you. This is a good chance for you to check in with your provider about disease prevention and staying healthy. In between checkups, there are plenty of things you can do on your own. Experts have done a lot of research about which lifestyle changes and preventive measures are most likely to keep you healthy. Ask your health care provider for more information. Weight and diet Eat a healthy diet  Be sure to include plenty of vegetables, fruits, low-fat dairy products, and lean protein.  Do not eat a lot of foods high in solid fats, added sugars, or salt.  Get regular exercise. This is one of the most important things you can do for your health.  Most adults should exercise for at least 150 minutes each week. The exercise should increase your heart rate and make you sweat (moderate-intensity exercise).  Most adults should also do strengthening exercises at least twice a week. This is in addition to the moderate-intensity exercise. Maintain a healthy weight  Body mass index (BMI) is a measurement that can be used to identify possible weight problems. It estimates body fat based on height and weight. Your health care provider can help determine your BMI and help you achieve or maintain a healthy weight.  For females 70 years of age and older:  A BMI below 18.5 is considered underweight.  A BMI of 18.5 to 24.9 is normal.  A BMI of 25 to 29.9 is  considered overweight.  A BMI of 30 and above is considered obese. Watch levels of cholesterol and blood lipids  You should start having your blood tested for lipids and cholesterol at 57 years of age, then have this test every 5 years.  You may need to have your cholesterol levels checked more often if:  Your lipid or cholesterol levels are high.  You are older than 57 years of age.  You are at high risk for heart disease. Cancer screening Lung Cancer  Lung cancer screening is recommended for adults 63-42 years old who are at high risk for lung cancer because of a history of smoking.  A yearly low-dose CT scan of the lungs is recommended for people who:  Currently smoke.  Have quit within the past 15 years.  Have at least a 30-pack-year history of smoking. A pack year is smoking an average of one pack of cigarettes a day for 1 year.  Yearly screening should continue until it has been 15 years since you quit.  Yearly screening should stop if you develop a health problem that would prevent you from having lung cancer treatment. Breast Cancer  Practice breast self-awareness. This means understanding how your breasts normally appear and feel.  It also means doing regular breast self-exams. Let your health care provider know about any changes, no matter how small.  If you are in your 20s or 30s, you should have a clinical breast exam (CBE) by a health care provider  every 1-3 years as part of a regular health exam.  If you are 5 or older, have a CBE every year. Also consider having a breast X-ray (mammogram) every year.  If you have a family history of breast cancer, talk to your health care provider about genetic screening.  If you are at high risk for breast cancer, talk to your health care provider about having an MRI and a mammogram every year.  Breast cancer gene (BRCA) assessment is recommended for women who have family members with BRCA-related cancers. BRCA-related  cancers include:  Breast.  Ovarian.  Tubal.  Peritoneal cancers.  Results of the assessment will determine the need for genetic counseling and BRCA1 and BRCA2 testing. Cervical Cancer  Your health care provider may recommend that you be screened regularly for cancer of the pelvic organs (ovaries, uterus, and vagina). This screening involves a pelvic examination, including checking for microscopic changes to the surface of your cervix (Pap test). You may be encouraged to have this screening done every 3 years, beginning at age 7.  For women ages 81-65, health care providers may recommend pelvic exams and Pap testing every 3 years, or they may recommend the Pap and pelvic exam, combined with testing for human papilloma virus (HPV), every 5 years. Some types of HPV increase your risk of cervical cancer. Testing for HPV may also be done on women of any age with unclear Pap test results.  Other health care providers may not recommend any screening for nonpregnant women who are considered low risk for pelvic cancer and who do not have symptoms. Ask your health care provider if a screening pelvic exam is right for you.  If you have had past treatment for cervical cancer or a condition that could lead to cancer, you need Pap tests and screening for cancer for at least 20 years after your treatment. If Pap tests have been discontinued, your risk factors (such as having a new sexual partner) need to be reassessed to determine if screening should resume. Some women have medical problems that increase the chance of getting cervical cancer. In these cases, your health care provider may recommend more frequent screening and Pap tests. Colorectal Cancer  This type of cancer can be detected and often prevented.  Routine colorectal cancer screening usually begins at 57 years of age and continues through 57 years of age.  Your health care provider may recommend screening at an earlier age if you have risk  factors for colon cancer.  Your health care provider may also recommend using home test kits to check for hidden blood in the stool.  A small camera at the end of a tube can be used to examine your colon directly (sigmoidoscopy or colonoscopy). This is done to check for the earliest forms of colorectal cancer.  Routine screening usually begins at age 63.  Direct examination of the colon should be repeated every 5-10 years through 57 years of age. However, you may need to be screened more often if early forms of precancerous polyps or small growths are found. Skin Cancer  Check your skin from head to toe regularly.  Tell your health care provider about any new moles or changes in moles, especially if there is a change in a mole's shape or color.  Also tell your health care provider if you have a mole that is larger than the size of a pencil eraser.  Always use sunscreen. Apply sunscreen liberally and repeatedly throughout the day.  Protect yourself  by wearing long sleeves, pants, a wide-brimmed hat, and sunglasses whenever you are outside. Heart disease, diabetes, and high blood pressure  High blood pressure causes heart disease and increases the risk of stroke. High blood pressure is more likely to develop in:  People who have blood pressure in the high end of the normal range (130-139/85-89 mm Hg).  People who are overweight or obese.  People who are African American.  If you are 62-20 years of age, have your blood pressure checked every 3-5 years. If you are 19 years of age or older, have your blood pressure checked every year. You should have your blood pressure measured twice-once when you are at a hospital or clinic, and once when you are not at a hospital or clinic. Record the average of the two measurements. To check your blood pressure when you are not at a hospital or clinic, you can use:  An automated blood pressure machine at a pharmacy.  A home blood pressure  monitor.  If you are between 50 years and 49 years old, ask your health care provider if you should take aspirin to prevent strokes.  Have regular diabetes screenings. This involves taking a blood sample to check your fasting blood sugar level.  If you are at a normal weight and have a low risk for diabetes, have this test once every three years after 57 years of age.  If you are overweight and have a high risk for diabetes, consider being tested at a younger age or more often. Preventing infection Hepatitis B  If you have a higher risk for hepatitis B, you should be screened for this virus. You are considered at high risk for hepatitis B if:  You were born in a country where hepatitis B is common. Ask your health care provider which countries are considered high risk.  Your parents were born in a high-risk country, and you have not been immunized against hepatitis B (hepatitis B vaccine).  You have HIV or AIDS.  You use needles to inject street drugs.  You live with someone who has hepatitis B.  You have had sex with someone who has hepatitis B.  You get hemodialysis treatment.  You take certain medicines for conditions, including cancer, organ transplantation, and autoimmune conditions. Hepatitis C  Blood testing is recommended for:  Everyone born from [birth year] through 1966.  Anyone with known risk factors for hepatitis C. Sexually transmitted infections (STIs)  You should be screened for sexually transmitted infections (STIs) including gonorrhea and chlamydia if:  You are sexually active and are younger than 57 years of age.  You are older than 57 years of age and your health care provider tells you that you are at risk for this type of infection.  Your sexual activity has changed since you were last screened and you are at an increased risk for chlamydia or gonorrhea. Ask your health care provider if you are at risk.  If you do not have HIV, but are at risk, it may be  recommended that you take a prescription medicine daily to prevent HIV infection. This is called pre-exposure prophylaxis (PrEP). You are considered at risk if:  You are sexually active and do not regularly use condoms or know the HIV status of your partner(s).  You take drugs by injection.  You are sexually active with a partner who has HIV. Talk with your health care provider about whether you are at high risk of being infected with HIV. If you  choose to begin PrEP, you should first be tested for HIV. You should then be tested every 3 months for as long as you are taking PrEP. Pregnancy  If you are premenopausal and you may become pregnant, ask your health care provider about preconception counseling.  If you may become pregnant, take 400 to 800 micrograms (mcg) of folic acid every day.  If you want to prevent pregnancy, talk to your health care provider about birth control (contraception). Osteoporosis and menopause  Osteoporosis is a disease in which the bones lose minerals and strength with aging. This can result in serious bone fractures. Your risk for osteoporosis can be identified using a bone density scan.  If you are 66 years of age or older, or if you are at risk for osteoporosis and fractures, ask your health care provider if you should be screened.  Ask your health care provider whether you should take a calcium or vitamin D supplement to lower your risk for osteoporosis.  Menopause may have certain physical symptoms and risks.  Hormone replacement therapy may reduce some of these symptoms and risks. Talk to your health care provider about whether hormone replacement therapy is right for you. Follow these instructions at home:  Schedule regular health, dental, and eye exams.  Stay current with your immunizations.  Do not use any tobacco products including cigarettes, chewing tobacco, or electronic cigarettes.  If you are pregnant, do not drink alcohol.  If you are  breastfeeding, limit how much and how often you drink alcohol.  Limit alcohol intake to no more than 1 drink per day for nonpregnant women. One drink equals 12 ounces of beer, 5 ounces of wine, or 1 ounces of hard liquor.  Do not use street drugs.  Do not share needles.  Ask your health care provider for help if you need support or information about quitting drugs.  Tell your health care provider if you often feel depressed.  Tell your health care provider if you have ever been abused or do not feel safe at home. This information is not intended to replace advice given to you by your health care provider. Make sure you discuss any questions you have with your health care provider. Document Released: 04/08/2011 Document Revised: 02/29/2016 Document Reviewed: 06/27/2015 Elsevier Interactive Patient Education  2017 [employer].

## 2017-02-27 NOTE — Progress Notes (Signed)
ANNUAL PREVENTATIVE CARE GYN  ENCOUNTER NOTE  Subjective:       Charlotte Watkins is a 56 y.o. No obstetric history on file. female here for a routine annual gynecologic exam.  Current complaints: 1.  Night sweats; patient is having significant vasomotor symptoms which are impairing sleeping. She has associated increased fatigue. Currently she is not sexually active without having symptoms of vaginal dryness or dyspareunia.  Patient is exercising routinely and is having difficulty shedding weight. Bowel function is normal. Bladder function is normal.   Gynecologic History Patient's last menstrual period was 03/05/2013. Contraception: post menopausal status Last Pap: . 2017 wnlResults were: normal Last mammogram: 10/2014 birad 1. Results were: normal  Obstetric History OB History  Gravida Para Term Preterm AB Living  3 3 3     3   SAB TAB Ectopic Multiple Live Births          3    # Outcome Date GA Lbr Len/2nd Weight Sex Delivery Anes PTL Lv  3 Term 1990   6 lb 11.2 oz (3.039 kg) M Vag-Spont   LIV  2 Term 1989   8 lb 1.6 oz (3.674 kg) F Vag-Spont   LIV  1 Term 1986   6 lb 14.4 oz (3.13 kg) F Vag-Spont   LIV      Past Medical History:  Diagnosis Date  . Allergic rhinitis, cause unspecified   . Dysmenorrhea   . Excessive or frequent menstruation   . Hypertension   . Leiomyoma of uterus, unspecified   . Migraine, unspecified, without mention of intractable migraine without mention of status migrainosus   . Palpitations   . Personal history of other genital system and obstetric disorders(V13.29)   . Seizure disorder ([place])   . Unspecified epilepsy without mention of intractable epilepsy   . Unspecified epilepsy without mention of intractable epilepsy   . Unspecified symptom associated with female genital organs     Past Surgical History:  Procedure Laterality Date  . HAND SURGERY    . SKIN CANCER EXCISION      Current Outpatient Prescriptions on File Prior to Visit   Medication Sig Dispense Refill  . Calcium Carbonate-Vitamin D (CALCIUM + D PO) Take by mouth daily.    Marland Kitchen eletriptan (RELPAX) 40 MG tablet One tablet by mouth at onset of headache. May repeat in 2 hours if headache persists or recurs. may repeat in 2 hours if necessary    . Multiple Vitamin (MULTIVITAMIN) tablet Take 1 tablet by mouth daily.     No current facility-administered medications on file prior to visit.     Allergies  Allergen Reactions  . Dilantin [Phenytoin Sodium Extended]     Social History   Social History  . Marital status: Divorced    Spouse name: N/A  . Number of children: N/A  . Years of education: N/A   Occupational History  . Not on file.   Social History Main Topics  . Smoking status: Never Smoker  . Smokeless tobacco: Not on file  . Alcohol use 1.8 oz/week    3 Glasses of wine per week  . Drug use: No  . Sexual activity: Not Currently    Birth control/ protection: Post-menopausal   Other Topics Concern  . Not on file   Social History Narrative  . No narrative on file    Family History  Problem Relation Age of Onset  . Heart disease Father   . Heart attack Father     The  following portions of the patient's history were reviewed and updated as appropriate: allergies, current medications, past family history, past medical history, past social history, past surgical history and problem list.  Review of Systems Review of Systems  Constitutional:       Night sweats and hot flashes troublesome  HENT: Negative.   Eyes: Negative.   Cardiovascular: Negative.   Gastrointestinal: Negative.   Genitourinary: Negative.   Musculoskeletal: Negative.   Skin: Negative.   Neurological: Negative.   Endo/Heme/Allergies: Negative.   Psychiatric/Behavioral: Negative.      Objective:   BP 124/78   Pulse 82   Ht 5\' 7"  (1.702 m)   Wt 176 lb 3.2 oz (79.9 kg)   LMP 03/05/2013   BMI 27.60 kg/m  CONSTITUTIONAL: Well-developed, well-nourished female in  no acute distress.  PSYCHIATRIC: Normal mood and affect. Normal behavior. Normal judgment and thought content. [place]: Alert and oriented to person, place, and time. Normal muscle tone coordination. No cranial nerve deficit noted. HENT:  Normocephalic, atraumatic, External right and left ear normal. Oropharynx is clear and moist EYES: Conjunctivae and EOM are normal. No scleral icterus.  NECK: Normal range of motion, supple, no masses.  Normal thyroid.  SKIN: Skin is warm and dry. No rash noted. Not diaphoretic. No erythema. No pallor. CARDIOVASCULAR: Normal heart rate noted, regular rhythm, no murmur. RESPIRATORY: Clear to auscultation bilaterally. Effort and breath sounds normal, no problems with respiration noted. BREASTS: Symmetric in size. No masses, skin changes, nipple drainage, or lymphadenopathy. ABDOMEN: Soft, normal bowel sounds, no distention noted.  No tenderness, rebound or guarding.  BLADDER: Normal PELVIC:  External Genitalia: Mild atrophic changes  BUS: Normal  Vagina: Moderate atrophy  Cervix: Normal; no lesions; no cervical motion tenderness  Uterus: Normal; midplane, normal size and shape, mobile, nontender  Adnexa: Normal; nonpalpable and nontender  RV: External Exam NormaI, No Rectal Masses and Normal Sphincter tone  MUSCULOSKELETAL: Normal range of motion. No tenderness.  No cyanosis, clubbing, or edema.  2+ distal pulses. LYMPHATIC: No Axillary, Supraclavicular, or Inguinal Adenopathy.    Assessment:   Annual gynecologic examination 56 y.o. Contraception: post menopausal status [id number] Menopausal vasomotor symptoms, especially night sweats and fatigue  Plan:  Pap: Pap Co Test Mammogram: Ordered Stool Guaiac Testing:  Ordered- never had colonoscopy- wants stool cards Labs: lipid fbs a1c tsh Routine preventative health maintenance measures emphasized: Exercise/Diet/Weight control, Tobacco Warnings, Alcohol/Substance use risks and Safe Sex Trial of Prempro  2.5 mg daily Return in 6 weeks for follow-up Return to [place], CMA  Brayton Mars, MD  Note: This dictation was prepared with Dragon dictation along with smaller phrase technology. Any transcriptional errors that result from this process are unintentional.

## 2017-03-02 LAB — PAP IG AND HPV HIGH-RISK
HPV, HIGH-RISK: NEGATIVE
PAP Smear Comment: 0

## 2017-03-04 ENCOUNTER — Other Ambulatory Visit: Payer: No Typology Code available for payment source

## 2017-03-05 LAB — LIPID PANEL
CHOLESTEROL TOTAL: 232 mg/dL — AB (ref 100–199)
Chol/HDL Ratio: 3.3 ratio (ref 0.0–4.4)
HDL: 70 mg/dL (ref >39)
LDL CALC: 136 mg/dL — AB (ref 0–99)
Triglycerides: 128 mg/dL (ref 0–149)
VLDL CHOLESTEROL CAL: 26 mg/dL (ref 5–40)

## 2017-03-05 LAB — TSH: TSH: 1.32 u[IU]/mL (ref 0.450–4.500)

## 2017-03-05 LAB — GLUCOSE, RANDOM: Glucose: 79 mg/dL (ref 65–99)

## 2017-03-05 LAB — HEMOGLOBIN A1C
ESTIMATED AVERAGE GLUCOSE: 114 mg/dL
Hgb A1c MFr Bld: 5.6 % (ref 4.8–5.6)

## 2017-03-12 ENCOUNTER — Telehealth: Payer: Self-pay | Admitting: Obstetrics and Gynecology

## 2017-03-12 NOTE — Telephone Encounter (Signed)
Pt was rxed Prempro 0.625-2.5 at ww visit. She did not pick it up d/t cost (150.00). She is considering bio identical  hormones. She would like to know which is better and why? If she decides on bio identical who should she reach out to. Aware mad will get this message tomorrow.

## 2017-03-12 NOTE — Telephone Encounter (Signed)
Patient called requesting to speak with you regarding HRT. Thanks

## 2017-03-13 MED ORDER — PROGESTERONE MICRONIZED 100 MG PO CAPS: 100.0000 mg | ORAL_CAPSULE | Freq: Every day | ORAL | 11 refills | Status: DC

## 2017-03-13 MED ORDER — ESTRADIOL 1 MG PO TABS: 1.0000 mg | ORAL_TABLET | Freq: Every day | ORAL | 11 refills | Status: DC

## 2017-03-13 NOTE — Telephone Encounter (Signed)
Pt aware per vm. Mad wants her to try estradiol 1mg  qd and Prometrium 100 mg qd. Med erx.

## 2017-03-24 ENCOUNTER — Ambulatory Visit
Admission: RE | Admit: 2017-03-24 | Discharge: 2017-03-24 | Disposition: A | Discharge status: Home / Self Care | Payer: PRIVATE HEALTH INSURANCE | Source: Ambulatory Visit | Attending: Obstetrics and Gynecology | Admitting: Obstetrics and Gynecology

## 2017-03-24 DIAGNOSIS — Z1231 Encounter for screening mammogram for malignant neoplasm of breast: Secondary | ICD-10-CM | POA: Diagnosis not present

## 2017-03-24 DIAGNOSIS — Z1239 Encounter for other screening for malignant neoplasm of breast: Secondary | ICD-10-CM

## 2017-04-16 ENCOUNTER — Ambulatory Visit (INDEPENDENT_AMBULATORY_CARE_PROVIDER_SITE_OTHER): Payer: No Typology Code available for payment source | Admitting: Obstetrics and Gynecology

## 2017-04-16 ENCOUNTER — Encounter: Payer: Self-pay | Admitting: Obstetrics and Gynecology

## 2017-04-16 DIAGNOSIS — N939 Abnormal uterine and vaginal bleeding, unspecified: Secondary | ICD-10-CM | POA: Insufficient documentation

## 2017-04-16 MED ORDER — NORETHINDRONE-ETH ESTRADIOL 0.5-2.5 MG-MCG PO TABS: 1.0000 | ORAL_TABLET | Freq: Every day | ORAL | 6 refills | Status: DC

## 2017-04-16 NOTE — Progress Notes (Signed)
Chief complaint: 1. Vasomotor symptoms 2. Medication management  Charlotte Watkins presents today for follow-up on HRT for vasomotor symptom affecting quality of life. Prempro-not taken because of cost Estradiol 1 mg daily/Prometrium 100 mg daily-side effects include abnormal uterine bleeding and menstrual cramps; 6 pound weight gain over 6 weeks is noted without significant change in diet or exercise habits. She has had resolution of her hot flashes and night sweats on the medication.  Past medical history, past surgical history, problem list, medications, and allergies are reviewed  OBJECTIVE: BP 120/74   Pulse 78   Ht 5\' 7"  (1.702 m)   Wt 182 lb 8 oz (82.8 kg)   LMP 03/05/2013   BMI 28.58 kg/m  Physical exam-deferred  ASSESSMENT: 1. HRT side effects, unacceptable-weight gain, cramps, vaginal bleeding 2. Desires change in medication 3. Prempro-too expensive  PLAN: 1. Discontinue estradiol 1 mg daily/Prometrium 100 mg daily 2. Begin FemHRT 3. Maintain menstrual calendar monitoring 4. Return in 4 weeks for follow-up  A total of 15 minutes were spent face-to-face with the patient during this encounter and over half of that time dealt with counseling and coordination of care.  Brayton Mars, MD  Note: This dictation was prepared with Dragon dictation along with smaller phrase technology. Any transcriptional errors that result from this process are unintentional.

## 2017-04-16 NOTE — Patient Instructions (Signed)
1. Discontinue estradiol and Prometrium 2. Begin FemHRT1 tablet daily 3. Return in 4 weeks for follow-up 4. Maintain menstrual calendar monitoring for any abnormal uterine bleeding  Ethinyl Estradiol; Norethindrone Acetate (estrogen replacement) What is this medicine? ETHINYL ESTRADIOL; NORETHINDRONE ACETATE (ETh in il es tra DYE ole; nor eth IN drone AS e tate) is used as hormone replacement in menopausal women who still have their uterus. This product helps to treat hot flashes and prevent osteoporosis (weak bones). This medicine may be used for other purposes; ask your health care provider or pharmacist if you have questions. COMMON BRAND NAME(S): femhrt 1/5, Paulina Fusi, [place], Jinteli 1/5 What should I tell my health care provider before I take this medicine? They need to know if you have any of these conditions: -blood vessel disease or blood clots -breast, cervical, endometrial, or uterine cancer -diabetes -endometriosis -fibroids -gallbladder disease -heart disease or recent heart attack -high blood cholesterol -high blood pressure -high level of calcium in the blood -hysterectomy -kidney disease -liver disease -mental depression -migraine headaches -porphyria -systemic lupus erythematosus (SLE) -tobacco smoker -stroke -vaginal bleeding -an unusual or allergic reaction to estrogens, progestins, other medicines, foods, dyes, or preservatives -pregnant or trying to get pregnant -breast-feeding How should I use this medicine? Take this medicine by mouth with a drink of water. You may take this medicine with food. Follow the directions on the prescription label. You will take one tablet daily at roughly the same time each day. Do not take your medicine more often than directed. Talk to your pediatrician regarding the use of this medicine in children. Special care may be needed. A patient package insert for the product will be given with each prescription and  refill. Read this sheet carefully each time. The sheet may change frequently. Overdosage: If you think you have taken too much of this medicine contact a poison control center or emergency room at once. NOTE: This medicine is only for you. Do not share this medicine with others. What if I miss a dose? If you miss a dose, take it as soon as you can. If it is almost time for your next dose, take only that dose. Do not take double or extra doses. What may interact with this medicine? Do not take this medicine with the following medication: -dasabuvir; ombitasvir; paritaprevir; ritonavir -ombitasvir; paritaprevir; ritonavir This medicine may also interact with the following medications: -acetaminophen -antibiotics or medicines for infections, especially rifampin, rifabutin, rifapentine, and griseofulvin, and possibly penicillins or tetracyclines -aprepitant -ascorbic acid (vitamin C) -atorvastatin -barbiturates, such as phenobarbital -benzodiazepines -bosentan -bromocriptine -carbamazepine -caffeine -clofibrate -cimetidine -cyclosporine -dantrolene -doxercalciferol -grapefruit juice -felbamate -hydrocortisone, cortisone -isoniazid (INH) -medications for diabetes, including pioglitazone -medicines for anxiety or sleeping problems, such as diazepam or temazepam -methotrexate -mineral oil -modafinil -mycophenolate -nefazodone -oxcarbazepine -phenytoin -prednisolone -raloxifene -ritonavir or other medicines for HIV infection or AIDS -rosuvastatin -selegiline -soy isoflavones supplements -St. John's wort -tamoxifen -theophylline -thyroid hormones -topiramate -tricyclic antidepressants -warfarin This list may not describe all possible interactions. Give your health care provider a list of all the medicines, herbs, non-prescription drugs, or dietary supplements you use. Also tell them if you smoke, drink alcohol, or use illegal drugs. Some items may interact with your  medicine. What should I watch for while using this medicine? Visit your health care professional for regular checks on your progress. You should have a complete check-up every 6 months. You will need a regular breast and pelvic exam. You should also discuss the need for regular mammograms  with your health care professional, and follow his or her guidelines. This medicine can make your body retain fluid, making your fingers, hands, or ankles swell. Your blood pressure can go up. Contact your doctor or health care professional if you feel you are retaining fluid. If you have any reason to think you are pregnant; stop taking this medicine at once and contact your doctor or health care professional. Tobacco smoking increases the risk of getting a blood clot or having a stroke, especially if you are more than 57 years old. You are strongly advised not to smoke. If you wear contact lenses and notice visual changes, or if the lenses begin to feel uncomfortable, consult your eye care specialist. If you are going to have elective surgery, you may need to stop taking this medicine beforehand. Consult your health care professional for advice prior to scheduling the surgery. What side effects may I notice from receiving this medicine? Side effects that you should report to your doctor or health care professional as soon as possible: -breakthrough bleeding and spotting -breast enlargement, tenderness, or discharge -chest pain -leg, arm, or groin pain -severe headaches -stomach or abdominal pain (severe) -sudden shortness of breath -swelling of the hands, feet or ankles, or rapid weight gain -vaginal yeast infection (irritation and white discharge) -vision or speech problems -yellowing of the eyes or skin Side effects that usually do not require medical attention (report to your doctor or health care professional if they continue or are bothersome): -change in appetite -change in sexual desire -mild  stomach upset -mood changes, anxiety, depression, frustration, anger, or emotional outbursts -nausea -skin rash, acne, or brown spots on the face -tiredness -weight gain This list may not describe all possible side effects. Call your doctor for medical advice about side effects. You may report side effects to FDA at 1-800-FDA-1088. Where should I keep my medicine? Keep out of the reach of children. Store at room temperature between 15 and 30 degrees C (59 and 86 degrees F). Throw away any unused medicine after the expiration date. NOTE: This sheet is a summary. It may not cover all possible information. If you have questions about this medicine, talk to your doctor, pharmacist, or health care provider.  2018 Elsevier/Gold Standard (2016-06-03 07:59:39)

## 2017-04-28 ENCOUNTER — Telehealth: Payer: Self-pay | Admitting: Obstetrics and Gynecology

## 2017-04-28 NOTE — Telephone Encounter (Signed)
Even with the new pill she has started a cycle again   Please call

## 2017-04-30 NOTE — Telephone Encounter (Signed)
See telephone encounter.

## 2017-04-30 NOTE — Telephone Encounter (Signed)
Pt aware per mad to continue with femhrt. U/s scheduled for 8/1 at 11:00. Will f/u with mad on 8/13.

## 2017-05-02 ENCOUNTER — Other Ambulatory Visit: Payer: Self-pay | Admitting: Obstetrics and Gynecology

## 2017-05-02 DIAGNOSIS — N939 Abnormal uterine and vaginal bleeding, unspecified: Secondary | ICD-10-CM

## 2017-05-07 ENCOUNTER — Ambulatory Visit (INDEPENDENT_AMBULATORY_CARE_PROVIDER_SITE_OTHER): Payer: No Typology Code available for payment source

## 2017-05-07 DIAGNOSIS — N939 Abnormal uterine and vaginal bleeding, unspecified: Secondary | ICD-10-CM

## 2017-05-19 ENCOUNTER — Encounter: Payer: Self-pay | Admitting: Obstetrics and Gynecology

## 2017-05-19 ENCOUNTER — Ambulatory Visit (INDEPENDENT_AMBULATORY_CARE_PROVIDER_SITE_OTHER): Payer: No Typology Code available for payment source | Admitting: Obstetrics and Gynecology

## 2017-05-19 VITALS — BP 177/111 | HR 99 | Ht 67.0 in | Wt 177.0 lb

## 2017-05-19 DIAGNOSIS — D259 Leiomyoma of uterus, unspecified: Secondary | ICD-10-CM

## 2017-05-19 DIAGNOSIS — D252 Subserosal leiomyoma of uterus: Secondary | ICD-10-CM

## 2017-05-19 DIAGNOSIS — N939 Abnormal uterine and vaginal bleeding, unspecified: Secondary | ICD-10-CM | POA: Diagnosis not present

## 2017-05-19 DIAGNOSIS — Z202 Contact with and (suspected) exposure to infections with a predominantly sexual mode of transmission: Secondary | ICD-10-CM | POA: Diagnosis not present

## 2017-05-19 DIAGNOSIS — Z79899 Other long term (current) drug therapy: Secondary | ICD-10-CM | POA: Diagnosis not present

## 2017-05-19 MED ORDER — AZITHROMYCIN 1 G PO PACK: 1.0000 g | PACK | Freq: Once | ORAL | 0 refills | Status: AC

## 2017-05-19 NOTE — Patient Instructions (Addendum)
1. STD screening is done today 2. Zithromax 1 g orally is prescribed 3. Continue with FEMHrt HRT therapy 4. Continue with menstrual calendar monitoring 5. Return in 3 months for follow-up on abnormal uterine bleeding 6. Return in 6 months for ultrasound and STI screen 7. Return in 1 week after ultrasound for review of results and further management planning 8. Return as needed if abnormal uterine bleeding persists/worsens.

## 2017-05-19 NOTE — Progress Notes (Signed)
Chief complaint: 1. Abnormal uterine bleeding 2. Vasomotor symptoms 3. STD exposure 4. Follow-up on ultrasound  Abnormal uterine bleeding and vasomotor symptoms: Patient presents for follow-up on HRT. Prempro was too expensive for the patient. Estradiol and Prometrium were ineffective as she was experiencing continuous abnormal uterine bleeding while on the hormone therapy. Charlotte Watkins has since been placed on [place] and has noted a gradual resolution of bleeding at this time. She has not had any abnormal uterine bleeding over the past week.  Patient is extremely distraught as she has discovered that a long-term monogamous partner was not faithful. She would like to be screened for STDs. She does not report any significant pelvic pain, vaginal discharge, or vulvar irritation.  The patient did have a pelvic ultrasound performed recently. Ultrasound results: Indications: AUB Findings:  The uterus is anteverted and measures 7.1 x 4.5 x 4.8 cm. Echo texture is heterogenous with evidence of several focal masses. Within the uterus are multiple suspected fibroids measuring: Fibroid 1: Right posterior fundal, subserosal, 0.7 x 0.8 x 1.2 cm Fibroid 2: Posterior fundal midline, subserosal, 1.1 x 0.7 x 0.7 cm Fibroid 3: Posterior fundal midline, subserosal, 1.1 x 1.0 x 1.3 cm   The Endometrium has a heterogenous appearance and 4.2 mm. Anterior to the fundal end of the endometrium, there is an area of heterogenicity which could possibly represent another fibroid vs other etiology. This area measures 1.9 x 2.1 x 2.1 cm. There is internal flow noted in this area.  Right Ovary measures 2.2 x 1.2 x 1.5 cm, and appears WNL. Left Ovary measures 2.0 x 1.1 x 1.6 cm, and appears WNL. Survey of the adnexa demonstrates no adnexal masses. There is no free fluid in the cul de sac.  Impression: 1. Fibroid uterus. 3 distinct fibroids are visualized and all appear subserosal. However, in the anterior fundal area,  just superior to the endometrium, is an area of heterogenicity which may be a submucosal fibroid versus other uterine etiology.  Recommendations: 1.Clinical correlation with the patient's History and Physical Exam.   Charlotte Watkins, RDMS, RVT Charlotte Mars, MD  OBJECTIVE: BP (!) 177/111   Pulse 99   Ht 5\' 7"  (1.702 m)   Wt 177 lb (80.3 kg)   LMP 03/05/2013   BMI 27.72 kg/m   Pleasant well-appearing female in no acute distress Back: No CVA tenderness Abdomen: Soft, nontender without organomegaly Pelvic exam: External genitalia-normal BUS-normal Vagina-moderate white secretions in vault Cervix-no lesions; no significant discharge Uterus-midplane, top normal size, mobile, nontender Adnexa-nonpalpable and nontender Rectovaginal-normal external exam  PROCEDURE: Wet prep Normal saline-moderate white blood cells; no Trichomonas; no clue cells KOH-no yeast  ASSESSMENT: 1. Abnormal uterine bleeding and vasomotor symptoms now controlled on FEMHrt 2. Pelvic ultrasound suspicious for possible endometrial polyp or submucosal fibroid 3. STI exposure  PLAN: 1. Nu swab plus 2. Wet prep as noted 3. STI screening 4. Continue FEMHRT 5. Maintain menstrual calendar monitoring 6. Return in 3 months for follow-up on abnormal uterine bleeding 7. Return in 6 months for repeat STI screen and repeat pelvic ultrasound 8. Zithromax 1 g orally  A total of 25 minutes were spent face-to-face with the patient during this encounter and over half of that time involved counseling and coordination of care.  Charlotte Mars, MD  Note: This dictation was prepared with Dragon dictation along with smaller phrase technology. Any transcriptional errors that result from this process are unintentional.

## 2017-05-20 LAB — HEPATITIS C ANTIBODY

## 2017-05-20 LAB — HSV(HERPES SIMPLEX VRS) I + II AB-IGG
HSV 1 Glycoprotein G Ab, IgG: <0.91 index (ref 0.00–0.90)
HSV 2 IgG, Type Spec: <0.91 index (ref 0.00–0.90)

## 2017-05-20 LAB — HIV ANTIBODY (ROUTINE TESTING W REFLEX): HIV Screen 4th Generation wRfx: NONREACTIVE

## 2017-05-20 LAB — RPR: RPR: NONREACTIVE

## 2017-05-20 LAB — HEPATITIS B SURFACE ANTIGEN: HEP B S AG: NEGATIVE

## 2017-05-25 LAB — NUSWAB VAGINITIS PLUS (VG+)
ATOPOBIUM VAGINAE: HIGH {score} — AB
CHLAMYDIA TRACHOMATIS, NAA: NEGATIVE
Candida albicans, NAA: NEGATIVE
Candida glabrata, NAA: NEGATIVE
Neisseria gonorrhoeae, NAA: NEGATIVE
TRICH VAG BY NAA: NEGATIVE

## 2017-09-23 ENCOUNTER — Other Ambulatory Visit: Payer: Self-pay | Admitting: Obstetrics and Gynecology

## 2017-09-23 DIAGNOSIS — D259 Leiomyoma of uterus, unspecified: Secondary | ICD-10-CM

## 2017-10-28 ENCOUNTER — Other Ambulatory Visit: Payer: Self-pay | Admitting: Obstetrics and Gynecology

## 2017-11-19 ENCOUNTER — Other Ambulatory Visit: Payer: No Typology Code available for payment source

## 2017-11-26 ENCOUNTER — Encounter: Payer: No Typology Code available for payment source | Admitting: Obstetrics and Gynecology

## 2017-12-01 ENCOUNTER — Ambulatory Visit (INDEPENDENT_AMBULATORY_CARE_PROVIDER_SITE_OTHER): Payer: No Typology Code available for payment source

## 2017-12-01 DIAGNOSIS — D259 Leiomyoma of uterus, unspecified: Secondary | ICD-10-CM | POA: Diagnosis not present

## 2018-02-27 NOTE — Progress Notes (Signed)
ANNUAL PREVENTATIVE CARE GYN  ENCOUNTER NOTE  Subjective:       Charlotte Watkins is a 57 y.o. G3 P3003. female here for a routine annual gynecologic exam.  Current complaints: None  Ultrasound last December demonstrated multi-fibroid uterus.  Patient is exercising routinely  Bowel function is normal. Bladder function is normal. No major interval health issues have been identified. Patient is inconsistently taking calcium with vitamin D. She is taking HRT in the form of thumb heart; vasomotor symptoms are controlled; main concern is cost of medication.  Gynecologic History Patient's last menstrual period was 03/05/2013. Contraception: post menopausal status Last Pap: . 02/2017 neg/neg Results were: normal Last mammogram: 03/2017 birad 1. Results were: normal  Obstetric History OB History  Gravida Para Term Preterm AB Living  3 3 3     3   SAB TAB Ectopic Multiple Live Births          3    # Outcome Date GA Lbr Len/2nd Weight Sex Delivery Anes PTL Lv  3 Term 1990   6 lb 11.2 oz (3.039 kg) M Vag-Spont   LIV  2 Term 1989   8 lb 1.6 oz (3.674 kg) F Vag-Spont   LIV  1 Term 1986   6 lb 14.4 oz (3.13 kg) F Vag-Spont   LIV    Past Medical History:  Diagnosis Date  . Allergic rhinitis, cause unspecified   . Dysmenorrhea   . Excessive or frequent menstruation   . Hypertension   . Leiomyoma of uterus, unspecified   . Migraine, unspecified, without mention of intractable migraine without mention of status migrainosus   . Palpitations   . Personal history of other genital system and obstetric disorders(V13.29)   . Seizure disorder ([place])   . Unspecified epilepsy without mention of intractable epilepsy   . Unspecified epilepsy without mention of intractable epilepsy   . Unspecified symptom associated with female genital organs     Past Surgical History:  Procedure Laterality Date  . HAND SURGERY    . SKIN CANCER EXCISION      Current Outpatient Medications on File Prior to Visit   Medication Sig Dispense Refill  . Calcium Carbonate-Vitamin D (CALCIUM + D PO) Take by mouth daily.    Marland Kitchen eletriptan (RELPAX) 40 MG tablet One tablet by mouth at onset of headache. May repeat in 2 hours if headache persists or recurs. may repeat in 2 hours if necessary    . Multiple Vitamin (MULTIVITAMIN) tablet Take 1 tablet by mouth daily.    . norethindrone-ethinyl estradiol (FEMHRT LOW DOSE) 0.5-2.5 MG-MCG tablet TAKE 1 TABLET BY MOUTH EVERY DAY 28 tablet 6   No current facility-administered medications on file prior to visit.     Allergies  Allergen Reactions  . Dilantin [Phenytoin Sodium Extended]     Social History   Socioeconomic History  . Marital status: Divorced    Spouse name: Not on file  . Number of children: Not on file  . Years of education: Not on file  . Highest education level: Not on file  Occupational History  . Not on file  Social Needs  . Financial resource strain: Not on file  . Food insecurity:    Worry: Not on file    Inability: Not on file  . Transportation needs:    Medical: Not on file    Non-medical: Not on file  Tobacco Use  . Smoking status: Never Smoker  . Smokeless tobacco: Never Used  Substance and Sexual  Activity  . Alcohol use: Yes    Alcohol/week: 1.8 oz    Types: 3 Glasses of wine per week    Comment: weekly  . Drug use: No  . Sexual activity: Not Currently    Birth control/protection: Post-menopausal  Lifestyle  . Physical activity:    Days per week: Not on file    Minutes per session: Not on file  . Stress: Not on file  Relationships  . Social connections:    Talks on phone: Not on file    Gets together: Not on file    Attends religious service: Not on file    Active member of club or organization: Not on file    Attends meetings of clubs or organizations: Not on file    Relationship status: Not on file  . Intimate partner violence:    Fear of current or ex partner: Not on file    Emotionally abused: Not on file     Physically abused: Not on file    Forced sexual activity: Not on file  Other Topics Concern  . Not on file  Social History Narrative  . Not on file    Family History  Problem Relation Age of Onset  . Heart disease Father   . Heart attack Father   . Breast cancer Neg Hx   . Ovarian cancer Neg Hx   . Colon cancer Neg Hx   . Diabetes Neg Hx     The following portions of the patient's history were reviewed and updated as appropriate: allergies, current medications, past family history, past medical history, past social history, past surgical history and problem list.  Review of Systems Review of Systems  Constitutional: Negative.   HENT: Negative.   Eyes: Negative.   Respiratory: Negative.   Cardiovascular: Negative.   Gastrointestinal: Negative.   Genitourinary: Negative.   Musculoskeletal: Negative.   Skin: Negative.   Neurological: Negative.   Endo/Heme/Allergies: Negative.   Psychiatric/Behavioral: Negative.       Objective:   LMP 03/05/2013  BP (!) 152/80   Pulse 83   Ht 5\' 7"  (1.702 m)   Wt 173 lb 9.6 oz (78.7 kg)   LMP 03/05/2013   BMI 27.19 kg/m  CONSTITUTIONAL: Well-developed, well-nourished female in no acute distress.  PSYCHIATRIC: Normal mood and affect. Normal behavior. Normal judgment and thought content. [place]: Alert and oriented to person, place, and time. Normal muscle tone coordination. No cranial nerve deficit noted. HENT:  Normocephalic, atraumatic, External right and left ear normal. EYES: Conjunctivae and EOM are normal. No scleral icterus.  NECK: Normal range of motion, supple, no masses.  Normal thyroid.  SKIN: Skin is warm and dry. No rash noted. Not diaphoretic. No erythema. No pallor. CARDIOVASCULAR: Normal heart rate noted, regular rhythm, no murmur. RESPIRATORY: Clear to auscultation bilaterally. Effort and breath sounds normal, no problems with respiration noted. BREASTS: Symmetric in size. No masses, skin changes, nipple drainage,  or lymphadenopathy. ABDOMEN: Soft, no distention noted.  No tenderness, rebound or guarding.  BLADDER: Normal PELVIC:  External Genitalia: Mild atrophic changes  BUS: Normal  Vagina: Moderate atrophy  Cervix: Normal; no lesions; no cervical motion tenderness  Uterus: Normal; midplane, normal size and shape, mobile, nontender  Adnexa: Normal; nonpalpable and nontender  RV: External Exam NormaI, No Rectal Masses and Normal Sphincter tone  MUSCULOSKELETAL: Normal range of motion. No tenderness.  No cyanosis, clubbing, or edema.  2+ distal pulses. LYMPHATIC: No Axillary, Supraclavicular, or Inguinal Adenopathy.    Assessment:  Annual gynecologic examination 57 y.o. Contraception: post menopausal status [id number] Menopausal symptoms, controlled on HRT  Plan:  Pap: due 2021 Mammogram: Ordered Stool Guaiac Testing:  Colonoscopy ordered Labs: lipid fbs a1c tsh Routine preventative health maintenance measures emphasized: Exercise/Diet/Weight control, Tobacco Warnings, Alcohol/Substance use risks and Safe Sex Refill FemHRT.  Consider switching to Bijuva bioidentical HRT Return to [place], [place]  Brayton Mars, MD  Note: This dictation was prepared with Dragon dictation along with smaller phrase technology. Any transcriptional errors that result from this process are unintentional.

## 2018-03-04 ENCOUNTER — Other Ambulatory Visit: Payer: Self-pay

## 2018-03-04 ENCOUNTER — Encounter: Payer: Self-pay | Admitting: Obstetrics and Gynecology

## 2018-03-04 ENCOUNTER — Ambulatory Visit (INDEPENDENT_AMBULATORY_CARE_PROVIDER_SITE_OTHER): Payer: No Typology Code available for payment source | Admitting: Obstetrics and Gynecology

## 2018-03-04 VITALS — BP 152/80 | HR 83 | Ht 67.0 in | Wt 173.6 lb

## 2018-03-04 DIAGNOSIS — Z01419 Encounter for gynecological examination (general) (routine) without abnormal findings: Secondary | ICD-10-CM | POA: Diagnosis not present

## 2018-03-04 DIAGNOSIS — Z1231 Encounter for screening mammogram for malignant neoplasm of breast: Secondary | ICD-10-CM

## 2018-03-04 DIAGNOSIS — D259 Leiomyoma of uterus, unspecified: Secondary | ICD-10-CM

## 2018-03-04 DIAGNOSIS — N951 Menopausal and female climacteric states: Secondary | ICD-10-CM

## 2018-03-04 DIAGNOSIS — Z1211 Encounter for screening for malignant neoplasm of colon: Secondary | ICD-10-CM | POA: Diagnosis not present

## 2018-03-04 DIAGNOSIS — N952 Postmenopausal atrophic vaginitis: Secondary | ICD-10-CM | POA: Diagnosis not present

## 2018-03-04 DIAGNOSIS — Z1239 Encounter for other screening for malignant neoplasm of breast: Secondary | ICD-10-CM

## 2018-03-04 MED ORDER — NORETHINDRONE-ETH ESTRADIOL 0.5-2.5 MG-MCG PO TABS: 1.0000 | ORAL_TABLET | Freq: Every day | ORAL | 11 refills | Status: DC

## 2018-03-04 NOTE — Patient Instructions (Signed)
1.  Pap smear is not done.  Next Pap smear is due 2021. 2.  Mammogram is ordered. 3.  Colonoscopy is ordered. 4.  Screening labs are ordered. 5.  Continue with healthy eating and exercise. 6.  Continue with calcium and vitamin D supplementation daily 7.  FEN heart is refilled for 1 year.  Check different pharmacies for pricing.  If desired, change to bioidentical HRT may be tried-Bijuva 8.  Return in 1 year for annual exam   Health Maintenance for Postmenopausal Women Menopause is a normal process in which your reproductive ability comes to an end. This process happens gradually over a span of months to years, usually between the ages of 66 and 52. Menopause is complete when you have missed 12 consecutive menstrual periods. It is important to talk with your health care provider about some of the most common conditions that affect postmenopausal women, such as heart disease, cancer, and bone loss (osteoporosis). Adopting a healthy lifestyle and getting preventive care can help to promote your health and wellness. Those actions can also lower your chances of developing some of these common conditions. What should I know about menopause? During menopause, you may experience a number of symptoms, such as:  Moderate-to-severe hot flashes.  Night sweats.  Decrease in sex drive.  Mood swings.  Headaches.  Tiredness.  Irritability.  Memory problems.  Insomnia.  Choosing to treat or not to treat menopausal changes is an individual decision that you make with your health care provider. What should I know about hormone replacement therapy and supplements? Hormone therapy products are effective for treating symptoms that are associated with menopause, such as hot flashes and night sweats. Hormone replacement carries certain risks, especially as you become older. If you are thinking about using estrogen or estrogen with progestin treatments, discuss the benefits and risks with your health care  provider. What should I know about heart disease and stroke? Heart disease, heart attack, and stroke become more likely as you age. This may be due, in part, to the hormonal changes that your body experiences during menopause. These can affect how your body processes dietary fats, triglycerides, and cholesterol. Heart attack and stroke are both medical emergencies. There are many things that you can do to help prevent heart disease and stroke:  Have your blood pressure checked at least every 1-2 years. High blood pressure causes heart disease and increases the risk of stroke.  If you are 5-8 years old, ask your health care provider if you should take aspirin to prevent a heart attack or a stroke.  Do not use any tobacco products, including cigarettes, chewing tobacco, or electronic cigarettes. If you need help quitting, ask your health care provider.  It is important to eat a healthy diet and maintain a healthy weight. ? Be sure to include plenty of vegetables, fruits, low-fat dairy products, and lean protein. ? Avoid eating foods that are high in solid fats, added sugars, or salt (sodium).  Get regular exercise. This is one of the most important things that you can do for your health. ? Try to exercise for at least 150 minutes each week. The type of exercise that you do should increase your heart rate and make you sweat. This is known as moderate-intensity exercise. ? Try to do strengthening exercises at least twice each week. Do these in addition to the moderate-intensity exercise.  Know your numbers.Ask your health care provider to check your cholesterol and your blood glucose. Continue to have your  blood tested as directed by your health care provider.  What should I know about cancer screening? There are several types of cancer. Take the following steps to reduce your risk and to catch any cancer development as early as possible. Breast Cancer  Practice breast self-awareness. ? This  means understanding how your breasts normally appear and feel. ? It also means doing regular breast self-exams. Let your health care provider know about any changes, no matter how small.  If you are 76 or older, have a clinician do a breast exam (clinical breast exam or CBE) every year. Depending on your age, family history, and medical history, it may be recommended that you also have a yearly breast X-ray (mammogram).  If you have a family history of breast cancer, talk with your health care provider about genetic screening.  If you are at high risk for breast cancer, talk with your health care provider about having an MRI and a mammogram every year.  Breast cancer (BRCA) gene test is recommended for women who have family members with BRCA-related cancers. Results of the assessment will determine the need for genetic counseling and BRCA1 and for BRCA2 testing. BRCA-related cancers include these types: ? Breast. This occurs in males or females. ? Ovarian. ? Tubal. This may also be called fallopian tube cancer. ? Cancer of the abdominal or pelvic lining (peritoneal cancer). ? Prostate. ? Pancreatic.  Cervical, Uterine, and Ovarian Cancer Your health care provider may recommend that you be screened regularly for cancer of the pelvic organs. These include your ovaries, uterus, and vagina. This screening involves a pelvic exam, which includes checking for microscopic changes to the surface of your cervix (Pap test).  For women ages 21-65, health care providers may recommend a pelvic exam and a Pap test every three years. For women ages 41-65, they may recommend the Pap test and pelvic exam, combined with testing for human papilloma virus (HPV), every five years. Some types of HPV increase your risk of cervical cancer. Testing for HPV may also be done on women of any age who have unclear Pap test results.  Other health care providers may not recommend any screening for nonpregnant women who are  considered low risk for pelvic cancer and have no symptoms. Ask your health care provider if a screening pelvic exam is right for you.  If you have had past treatment for cervical cancer or a condition that could lead to cancer, you need Pap tests and screening for cancer for at least 20 years after your treatment. If Pap tests have been discontinued for you, your risk factors (such as having a new sexual partner) need to be reassessed to determine if you should start having screenings again. Some women have medical problems that increase the chance of getting cervical cancer. In these cases, your health care provider may recommend that you have screening and Pap tests more often.  If you have a family history of uterine cancer or ovarian cancer, talk with your health care provider about genetic screening.  If you have vaginal bleeding after reaching menopause, tell your health care provider.  There are currently no reliable tests available to screen for ovarian cancer.  Lung Cancer Lung cancer screening is recommended for adults 89-12 years old who are at high risk for lung cancer because of a history of smoking. A yearly low-dose CT scan of the lungs is recommended if you:  Currently smoke.  Have a history of at least 30 pack-years of smoking  and you currently smoke or have quit within the past 15 years. A pack-year is smoking an average of one pack of cigarettes per day for one year.  Yearly screening should:  Continue until it has been 15 years since you quit.  Stop if you develop a health problem that would prevent you from having lung cancer treatment.  Colorectal Cancer  This type of cancer can be detected and can often be prevented.  Routine colorectal cancer screening usually begins at age 73 and continues through age 18.  If you have risk factors for colon cancer, your health care provider may recommend that you be screened at an earlier age.  If you have a family history of  colorectal cancer, talk with your health care provider about genetic screening.  Your health care provider may also recommend using home test kits to check for hidden blood in your stool.  A small camera at the end of a tube can be used to examine your colon directly (sigmoidoscopy or colonoscopy). This is done to check for the earliest forms of colorectal cancer.  Direct examination of the colon should be repeated every 5-10 years until age 22. However, if early forms of precancerous polyps or small growths are found or if you have a family history or genetic risk for colorectal cancer, you may need to be screened more often.  Skin Cancer  Check your skin from head to toe regularly.  Monitor any moles. Be sure to tell your health care provider: ? About any new moles or changes in moles, especially if there is a change in a mole's shape or color. ? If you have a mole that is larger than the size of a pencil eraser.  If any of your family members has a history of skin cancer, especially at a young age, talk with your health care provider about genetic screening.  Always use sunscreen. Apply sunscreen liberally and repeatedly throughout the day.  Whenever you are outside, protect yourself by wearing long sleeves, pants, a wide-brimmed hat, and sunglasses.  What should I know about osteoporosis? Osteoporosis is a condition in which bone destruction happens more quickly than new bone creation. After menopause, you may be at an increased risk for osteoporosis. To help prevent osteoporosis or the bone fractures that can happen because of osteoporosis, the following is recommended:  If you are 28-58 years old, get at least 1,000 mg of calcium and at least 600 mg of vitamin D per day.  If you are older than age 51 but younger than age 31, get at least 1,200 mg of calcium and at least 600 mg of vitamin D per day.  If you are older than age 59, get at least 1,200 mg of calcium and at least 800 mg  of vitamin D per day.  Smoking and excessive alcohol intake increase the risk of osteoporosis. Eat foods that are rich in calcium and vitamin D, and do weight-bearing exercises several times each week as directed by your health care provider. What should I know about how menopause affects my mental health? Depression may occur at any age, but it is more common as you become older. Common symptoms of depression include:  Low or sad mood.  Changes in sleep patterns.  Changes in appetite or eating patterns.  Feeling an overall lack of motivation or enjoyment of activities that you previously enjoyed.  Frequent crying spells.  Talk with your health care provider if you think that you are experiencing  depression. What should I know about immunizations? It is important that you get and maintain your immunizations. These include:  Tetanus, diphtheria, and pertussis (Tdap) booster vaccine.  Influenza every year before the flu season begins.  Pneumonia vaccine.  Shingles vaccine.  Your health care provider may also recommend other immunizations. This information is not intended to replace advice given to you by your health care provider. Make sure you discuss any questions you have with your health care provider. Document Released: 11/15/2005 Document Revised: 04/12/2016 Document Reviewed: 06/27/2015 Elsevier Interactive Patient Education  2018 [employer].

## 2018-03-05 ENCOUNTER — Other Ambulatory Visit: Payer: No Typology Code available for payment source

## 2018-03-06 LAB — TSH: TSH: 1.24 u[IU]/mL (ref 0.450–4.500)

## 2018-03-06 LAB — HEMOGLOBIN A1C
Est. average glucose Bld gHb Est-mCnc: 111 mg/dL
HEMOGLOBIN A1C: 5.5 % (ref 4.8–5.6)

## 2018-03-06 LAB — LIPID PANEL
CHOLESTEROL TOTAL: 220 mg/dL — AB (ref 100–199)
Chol/HDL Ratio: 3.4 ratio (ref 0.0–4.4)
HDL: 65 mg/dL (ref >39)
LDL Calculated: 138 mg/dL — ABNORMAL HIGH (ref 0–99)
TRIGLYCERIDES: 84 mg/dL (ref 0–149)
VLDL Cholesterol Cal: 17 mg/dL (ref 5–40)

## 2018-03-06 LAB — GLUCOSE, RANDOM: GLUCOSE: 88 mg/dL (ref 65–99)

## 2018-04-03 ENCOUNTER — Ambulatory Visit
Admission: RE | Admit: 2018-04-03 | Discharge: 2018-04-03 | Disposition: A | Discharge status: Home / Self Care | Payer: No Typology Code available for payment source | Source: Ambulatory Visit | Attending: Gastroenterology | Admitting: Gastroenterology

## 2018-04-03 ENCOUNTER — Ambulatory Visit: Payer: No Typology Code available for payment source | Admitting: Anesthesiology

## 2018-04-03 ENCOUNTER — Other Ambulatory Visit: Payer: Self-pay

## 2018-04-03 ENCOUNTER — Encounter
Admission: RE | Disposition: A | Discharge status: Home / Self Care | Payer: Self-pay | Source: Ambulatory Visit | Attending: Gastroenterology

## 2018-04-03 DIAGNOSIS — D125 Benign neoplasm of sigmoid colon: Secondary | ICD-10-CM | POA: Insufficient documentation

## 2018-04-03 DIAGNOSIS — Z1211 Encounter for screening for malignant neoplasm of colon: Secondary | ICD-10-CM | POA: Insufficient documentation

## 2018-04-03 DIAGNOSIS — G40909 Epilepsy, unspecified, not intractable, without status epilepticus: Secondary | ICD-10-CM | POA: Diagnosis not present

## 2018-04-03 DIAGNOSIS — Z79899 Other long term (current) drug therapy: Secondary | ICD-10-CM | POA: Insufficient documentation

## 2018-04-03 DIAGNOSIS — K5730 Diverticulosis of large intestine without perforation or abscess without bleeding: Secondary | ICD-10-CM | POA: Insufficient documentation

## 2018-04-03 DIAGNOSIS — I10 Essential (primary) hypertension: Secondary | ICD-10-CM | POA: Diagnosis not present

## 2018-04-03 HISTORY — PX: COLONOSCOPY WITH PROPOFOL: SHX5780

## 2018-04-03 SURGERY — COLONOSCOPY WITH PROPOFOL
Anesthesia: General

## 2018-04-03 MED ORDER — PROPOFOL 10 MG/ML IV BOLUS
INTRAVENOUS | Status: AC
  Filled 2018-04-03: qty 40

## 2018-04-03 MED ORDER — LIDOCAINE HCL (CARDIAC) PF 100 MG/5ML IV SOSY
PREFILLED_SYRINGE | INTRAVENOUS | Status: DC | PRN
  Administered 2018-04-03: 40 mg via INTRAVENOUS

## 2018-04-03 MED ORDER — SODIUM CHLORIDE 0.9 % IV SOLN
INTRAVENOUS | Status: DC
  Administered 2018-04-03: 08:00:00 via INTRAVENOUS

## 2018-04-03 MED ORDER — PROPOFOL 10 MG/ML IV BOLUS
INTRAVENOUS | Status: DC | PRN
  Administered 2018-04-03: 20 mg via INTRAVENOUS
  Administered 2018-04-03: 50 mg via INTRAVENOUS

## 2018-04-03 MED ORDER — PROPOFOL 500 MG/50ML IV EMUL
INTRAVENOUS | Status: DC | PRN
  Administered 2018-04-03: 150 ug/kg/min via INTRAVENOUS

## 2018-04-03 NOTE — Anesthesia Preprocedure Evaluation (Signed)
Anesthesia Evaluation  Patient identified by MRN, date of birth, ID band Patient awake    Reviewed: Allergy & Precautions, NPO status , Patient's Chart, lab work & pertinent test results  Airway Mallampati: II  TM Distance: >3 FB     Dental   Pulmonary shortness of breath,    Pulmonary exam normal        Cardiovascular hypertension, Normal cardiovascular exam     Neuro/Psych  Headaches, Seizures -,     GI/Hepatic negative GI ROS, Neg liver ROS,   Endo/Other  negative endocrine ROS  Renal/GU negative Renal ROS  negative genitourinary   Musculoskeletal negative musculoskeletal ROS (+)   Abdominal Normal abdominal exam  (+)   Peds negative pediatric ROS (+)  Hematology negative hematology ROS (+)   Anesthesia Other Findings Past Medical History: No date: Allergic rhinitis, cause unspecified No date: Dysmenorrhea No date: Excessive or frequent menstruation No date: Hypertension No date: Leiomyoma of uterus, unspecified No date: Migraine, unspecified, without mention of intractable  migraine without mention of status migrainosus No date: Palpitations No date: Personal history of other genital system and obstetric  disorders(V13.29) No date: Seizure disorder ([place]) No date: Unspecified epilepsy without mention of intractable epilepsy No date: Unspecified epilepsy without mention of intractable epilepsy No date: Unspecified symptom associated with female genital organs  Reproductive/Obstetrics                             Anesthesia Physical Anesthesia Plan  ASA: II  Anesthesia Plan: General   Post-op Pain Management:    Induction: Intravenous  PONV Risk Score and Plan: TIVA  Airway Management Planned:   Additional Equipment:   Intra-op Plan:   Post-operative Plan:   Informed Consent: I have reviewed the patients History and Physical, chart, labs and discussed the procedure  including the risks, benefits and alternatives for the proposed anesthesia with the patient or authorized representative who has indicated his/her understanding and acceptance.   Dental advisory given  Plan Discussed with: CRNA and Surgeon  Anesthesia Plan Comments:         Anesthesia Quick Evaluation

## 2018-04-03 NOTE — Anesthesia Postprocedure Evaluation (Signed)
Anesthesia Post Note  Patient: Charlotte Watkins  Procedure(s) Performed: COLONOSCOPY WITH PROPOFOL (N/A )  Patient location during evaluation: Endoscopy Anesthesia Type: General Level of consciousness: awake and alert and oriented Pain management: pain level controlled Vital Signs Assessment: post-procedure vital signs reviewed and stable Respiratory status: spontaneous breathing Cardiovascular status: blood pressure returned to baseline Anesthetic complications: no     Last Vitals:  Vitals:   04/03/18 0936 04/03/18 0946  BP: 135/83 (!) 138/91  Pulse: 63 63  Resp: 13 11  Temp:    SpO2: 100% 100%    Last Pain:  Vitals:   04/03/18 0946  TempSrc:   PainSc: 0-No pain                 Pattrick Bady

## 2018-04-03 NOTE — H&P (Signed)
Jonathon Bellows, MD [address] [place], [place], [place], [address] [address], [place], [place], [place], [address] Phone: [phone]  Fax: [phone]  Primary Care Physician:  Maryland Pink, MD   Pre-Procedure History & Physical: HPI:  Charlotte Watkins is a 57 y.o. female is here for an colonoscopy.   Past Medical History:  Diagnosis Date  . Allergic rhinitis, cause unspecified   . Dysmenorrhea   . Excessive or frequent menstruation   . Hypertension    in the past, better after hormone therapy  . Leiomyoma of uterus, unspecified   . Migraine, unspecified, without mention of intractable migraine without mention of status migrainosus    migraines, better after starting hormone therapy  . Palpitations   . Personal history of other genital system and obstetric disorders(V13.29)   . Seizure disorder ([place])   . Unspecified epilepsy without mention of intractable epilepsy    last Sz over 25 years ago  . Unspecified epilepsy without mention of intractable epilepsy   . Unspecified symptom associated with female genital organs     Past Surgical History:  Procedure Laterality Date  . HAND SURGERY    . NO PAST SURGERIES    . SKIN CANCER EXCISION      Prior to Admission medications   Medication Sig Start Date End Date Taking? Authorizing Provider  eletriptan (RELPAX) 40 MG tablet One tablet by mouth at onset of headache. May repeat in 2 hours if headache persists or recurs. may repeat in 2 hours if necessary   Yes [provider]  norethindrone-ethinyl estradiol (FEMHRT LOW DOSE) 0.5-2.5 MG-MCG tablet Take 1 tablet by mouth daily. 03/04/18  Yes Defrancesco, Alanda Slim, MD  Calcium Carbonate-Vitamin D (CALCIUM + D PO) Take by mouth daily.    [provider]  fluticasone (FLONASE) 50 MCG/ACT nasal spray fluticasone propionate 50 mcg/actuation nasal spray,suspension    [provider]  Multiple Vitamin (MULTIVITAMIN) tablet Take 1 tablet by mouth  daily.    [provider]    Allergies as of 03/04/2018 - Review Complete 03/04/2018  Allergen Reaction Noted  . Dilantin [phenytoin sodium extended]  02/15/2013    Family History  Problem Relation Age of Onset  . Heart disease Father   . Heart attack Father   . Diabetes Father   . Breast cancer Neg Hx   . Ovarian cancer Neg Hx   . Colon cancer Neg Hx     Social History   Socioeconomic History  . Marital status: Divorced    Spouse name: Not on file  . Number of children: Not on file  . Years of education: Not on file  . Highest education level: Not on file  Occupational History  . Not on file  Social Needs  . Financial resource strain: Not on file  . Food insecurity:    Worry: Not on file    Inability: Not on file  . Transportation needs:    Medical: Not on file    Non-medical: Not on file  Tobacco Use  . Smoking status: Never Smoker  . Smokeless tobacco: Never Used  Substance and Sexual Activity  . Alcohol use: Yes    Alcohol/week: 1.8 oz    Types: 3 Glasses of wine per week    Comment: weekly  . Drug use: No  . Sexual activity: Not Currently    Birth control/protection: Post-menopausal  Lifestyle  . Physical activity:    Days per week: 3 days  Minutes per session: 60 min  . Stress: Not on file  Relationships  . Social connections:    Talks on phone: Not on file    Gets together: Not on file    Attends religious service: Not on file    Active member of club or organization: Not on file    Attends meetings of clubs or organizations: Not on file    Relationship status: Not on file  . Intimate partner violence:    Fear of current or ex partner: Not on file    Emotionally abused: Not on file    Physically abused: Not on file    Forced sexual activity: Not on file  Other Topics Concern  . Not on file  Social History Narrative  . Not on file    Review of Systems: See HPI, otherwise negative ROS  Physical Exam: BP (!) 139/102   Pulse 84    Temp (!) 97 F (36.1 C)   Resp (!) 22   Ht 5\' 7"  (1.702 m)   Wt 164 lb (74.4 kg)   LMP 03/05/2013   SpO2 100%   BMI 25.69 kg/m  General:   Alert,  pleasant and cooperative in NAD Head:  Normocephalic and atraumatic. Neck:  Supple; no masses or thyromegaly. Lungs:  Clear throughout to auscultation, normal respiratory effort.    Heart:  +S1, +S2, Regular rate and rhythm, No edema. Abdomen:  Soft, nontender and nondistended. Normal bowel sounds, without guarding, and without rebound.   Neurologic:  Alert and  oriented x4;  grossly normal neurologically.  Impression/Plan: Charlotte Watkins is here for an colonoscopy to be performed for Screening colonoscopy average risk   Risks, benefits, limitations, and alternatives regarding  colonoscopy have been reviewed with the patient.  Questions have been answered.  All parties agreeable.   Jonathon Bellows, MD  04/03/2018, 8:38 AM

## 2018-04-03 NOTE — Transfer of Care (Signed)
Immediate Anesthesia Transfer of Care Note  Patient: Charlotte Watkins  Procedure(s) Performed: COLONOSCOPY WITH PROPOFOL (N/A )  Patient Location: Endoscopy Unit  Anesthesia Type:MAC  Level of Consciousness: awake, alert  and oriented  Airway & Oxygen Therapy: Patient Spontanous Breathing  Post-op Assessment: Report given to RN and Post -op Vital signs reviewed and stable  Post vital signs: Reviewed and stable  Last Vitals:  Vitals Value Taken Time  BP    Temp    Pulse    Resp    SpO2      Last Pain: There were no vitals filed for this visit.       Complications: No apparent anesthesia complications

## 2018-04-03 NOTE — Op Note (Signed)
[facility]astroenterology Patient Name: Charlotte Watkins Procedure Date: 04/03/2018 8:39 AM MRN: [id number] Account #: [id number] Date of Birth: [birth year] Admit Type: Outpatient Age: 57 Room: [facility] ENDO ROOM 4 Gender: Female Note Status: Finalized Procedure:            Colonoscopy Indications:          Screening for colorectal malignant neoplasm Providers:            Jonathon Bellows MD, MD Medicines:            Monitored Anesthesia Care Complications:        No immediate complications. Procedure:            Pre-Anesthesia Assessment:                       - Prior to the procedure, a History and Physical was                        performed, and patient medications, allergies and                        sensitivities were reviewed. The patient's tolerance of                        previous anesthesia was reviewed.                       - The risks and benefits of the procedure and the                        sedation options and risks were discussed with the                        patient. All questions were answered and informed                        consent was obtained.                       - ASA Grade Assessment: II - A patient with mild                        systemic disease.                       After obtaining informed consent, the colonoscope was                        passed under direct vision. Throughout the procedure,                        the patient's blood pressure, pulse, and oxygen                        saturations were monitored continuously. The                        Colonoscope was introduced through the anus and                        advanced to the the cecum, identified by the  appendiceal orifice, IC valve and transillumination.                        The colonoscopy was performed without difficulty. The                        patient tolerated the procedure well. The quality of                        the bowel  preparation was good. Findings:      The perianal and digital rectal examinations were normal.      A 8 mm polyp was found in the sigmoid colon. The polyp was sessile.       Polypectomy was attempted, initially using a cold snare. Polyp resection       was incomplete with this device. This intervention then required a       different device and polypectomy technique. The polyp was removed with a       cold biopsy forceps. Resection and retrieval were complete.      Multiple small-mouthed diverticula were found in the entire colon.      The exam was otherwise without abnormality on direct and retroflexion       views. Impression:           - One 8 mm polyp in the sigmoid colon, removed with a                        cold biopsy forceps. Resected and retrieved.                       - Diverticulosis in the entire examined colon.                       - The examination was otherwise normal on direct and                        retroflexion views. Recommendation:       - Discharge patient to home (with escort).                       - Resume previous diet.                       - Continue present medications.                       - Await pathology results.                       - Repeat colonoscopy for surveillance based on                        pathology results. Procedure Code(s):    --- Professional ---                       [phone], Colonoscopy, flexible; with biopsy, single or                        multiple Diagnosis Code(s):    --- Professional ---  Z12.11, Encounter for screening for malignant neoplasm                        of colon                       D12.5, Benign neoplasm of sigmoid colon                       K57.30, Diverticulosis of large intestine without                        perforation or abscess without bleeding CPT copyright 2017 American Medical Association. All rights reserved. The codes documented in this report are preliminary and upon coder  review may  be revised to meet current compliance requirements. Jonathon Bellows, MD Jonathon Bellows MD, MD 04/03/2018 9:20:26 AM This report has been signed electronically. Number of Addenda: 0 Note Initiated On: 04/03/2018 8:39 AM Scope Withdrawal Time: 0 hours 18 minutes 23 seconds  Total Procedure Duration: 0 hours 24 minutes 6 seconds       [facility]

## 2018-04-03 NOTE — Anesthesia Post-op Follow-up Note (Signed)
Anesthesia QCDR form completed.        

## 2018-04-07 LAB — SURGICAL PATHOLOGY

## 2018-04-10 ENCOUNTER — Encounter: Payer: Self-pay | Admitting: Gastroenterology

## 2018-06-15 ENCOUNTER — Ambulatory Visit
Admission: RE | Admit: 2018-06-15 | Discharge: 2018-06-15 | Disposition: A | Discharge status: Home / Self Care | Payer: No Typology Code available for payment source | Source: Ambulatory Visit | Attending: Obstetrics and Gynecology | Admitting: Obstetrics and Gynecology

## 2018-06-15 DIAGNOSIS — Z1231 Encounter for screening mammogram for malignant neoplasm of breast: Secondary | ICD-10-CM | POA: Diagnosis not present

## 2018-06-15 DIAGNOSIS — Z1239 Encounter for other screening for malignant neoplasm of breast: Secondary | ICD-10-CM

## 2019-03-09 ENCOUNTER — Encounter: Payer: No Typology Code available for payment source | Admitting: Obstetrics and Gynecology

## 2019-03-18 ENCOUNTER — Telehealth: Payer: Self-pay | Admitting: Obstetrics and Gynecology

## 2019-03-18 NOTE — Telephone Encounter (Signed)
The patient called and stated that she needs a prescription refill of her medication norethindrone-ethinyl estradiol (FEMHRT LOW DOSE) 0.5-2.5 MG-MCG tablet. Please advise.

## 2019-03-19 MED ORDER — NORETHINDRONE-ETH ESTRADIOL 0.5-2.5 MG-MCG PO TABS: 1.0000 | ORAL_TABLET | Freq: Every day | ORAL | 4 refills | Status: DC

## 2019-03-19 NOTE — Telephone Encounter (Signed)
Pt called no answer LM via voicemail that her Femhrt was refilled until her appointment with [facility] in August.

## 2019-05-26 ENCOUNTER — Ambulatory Visit (INDEPENDENT_AMBULATORY_CARE_PROVIDER_SITE_OTHER): Payer: No Typology Code available for payment source | Admitting: Obstetrics and Gynecology

## 2019-05-26 ENCOUNTER — Other Ambulatory Visit: Payer: Self-pay

## 2019-05-26 ENCOUNTER — Encounter: Payer: Self-pay | Admitting: Obstetrics and Gynecology

## 2019-05-26 VITALS — BP 170/106 | HR 84 | Ht 67.0 in | Wt 173.9 lb

## 2019-05-26 DIAGNOSIS — N952 Postmenopausal atrophic vaginitis: Secondary | ICD-10-CM

## 2019-05-26 DIAGNOSIS — N951 Menopausal and female climacteric states: Secondary | ICD-10-CM | POA: Diagnosis not present

## 2019-05-26 DIAGNOSIS — E785 Hyperlipidemia, unspecified: Secondary | ICD-10-CM

## 2019-05-26 DIAGNOSIS — Z1239 Encounter for other screening for malignant neoplasm of breast: Secondary | ICD-10-CM

## 2019-05-26 DIAGNOSIS — Z01419 Encounter for gynecological examination (general) (routine) without abnormal findings: Secondary | ICD-10-CM | POA: Diagnosis not present

## 2019-05-26 DIAGNOSIS — D259 Leiomyoma of uterus, unspecified: Secondary | ICD-10-CM

## 2019-05-26 NOTE — Patient Instructions (Signed)
Preventive Care 40-59 Years Old, Female Preventive care refers to visits with your health care provider and lifestyle choices that can promote health and wellness. This includes:  A yearly physical exam. This may also be called an annual well check.  Regular dental visits and eye exams.  Immunizations.  Screening for certain conditions.  Healthy lifestyle choices, such as eating a healthy diet, getting regular exercise, not using drugs or products that contain nicotine and tobacco, and limiting alcohol use. What can I expect for my preventive care visit? Physical exam Your health care provider will check your:  Height and weight. This may be used to calculate body mass index (BMI), which tells if you are at a healthy weight.  Heart rate and blood pressure.  Skin for abnormal spots. Counseling Your health care provider may ask you questions about your:  Alcohol, tobacco, and drug use.  Emotional well-being.  Home and relationship well-being.  Sexual activity.  Eating habits.  Work and work environment.  Method of birth control.  Menstrual cycle.  Pregnancy history. What immunizations do I need?  Influenza (flu) vaccine  This is recommended every year. Tetanus, diphtheria, and pertussis (Tdap) vaccine  You may need a Td booster every 10 years. Varicella (chickenpox) vaccine  You may need this if you have not been vaccinated. Zoster (shingles) vaccine  You may need this after age 60. Measles, mumps, and rubella (MMR) vaccine  You may need at least one dose of MMR if you were born in 1957 or later. You may also need a second dose. Pneumococcal conjugate (PCV13) vaccine  You may need this if you have certain conditions and were not previously vaccinated. Pneumococcal polysaccharide (PPSV23) vaccine  You may need one or two doses if you smoke cigarettes or if you have certain conditions. Meningococcal conjugate (MenACWY) vaccine  You may need this if you  have certain conditions. Hepatitis A vaccine  You may need this if you have certain conditions or if you travel or work in places where you may be exposed to hepatitis A. Hepatitis B vaccine  You may need this if you have certain conditions or if you travel or work in places where you may be exposed to hepatitis B. Haemophilus influenzae type b (Hib) vaccine  You may need this if you have certain conditions. Human papillomavirus (HPV) vaccine  If recommended by your health care provider, you may need three doses over 6 months. You may receive vaccines as individual doses or as more than one vaccine together in one shot (combination vaccines). Talk with your health care provider about the risks and benefits of combination vaccines. What tests do I need? Blood tests  Lipid and cholesterol levels. These may be checked every 5 years, or more frequently if you are over 50 years old.  Hepatitis C test.  Hepatitis B test. Screening  Lung cancer screening. You may have this screening every year starting at age 55 if you have a 30-pack-year history of smoking and currently smoke or have quit within the past 15 years.  Colorectal cancer screening. All adults should have this screening starting at age 50 and continuing until age 75. Your health care provider may recommend screening at age 45 if you are at increased risk. You will have tests every 1-10 years, depending on your results and the type of screening test.  Diabetes screening. This is done by checking your blood sugar (glucose) after you have not eaten for a while (fasting). You may have this   done every 1-3 years.  Mammogram. This may be done every 1-2 years. Talk with your health care provider about when you should start having regular mammograms. This may depend on whether you have a family history of breast cancer.  BRCA-related cancer screening. This may be done if you have a family history of breast, ovarian, tubal, or peritoneal  cancers.  Pelvic exam and Pap test. This may be done every 3 years starting at age 23. Starting at age 86, this may be done every 5 years if you have a Pap test in combination with an HPV test. Other tests  Sexually transmitted disease (STD) testing.  Bone density scan. This is done to screen for osteoporosis. You may have this scan if you are at high risk for osteoporosis. Follow these instructions at home: Eating and drinking  Eat a diet that includes fresh fruits and vegetables, whole grains, lean protein, and low-fat dairy.  Take vitamin and mineral supplements as recommended by your health care provider.  Do not drink alcohol if: ? Your health care provider tells you not to drink. ? You are pregnant, may be pregnant, or are planning to become pregnant.  If you drink alcohol: ? Limit how much you have to 0-1 drink a day. ? Be aware of how much alcohol is in your drink. In the [place] one drink equals one 12 oz bottle of beer (355 mL), one 5 oz glass of wine (148 mL), or one 1 oz glass of hard liquor (44 mL). Lifestyle  Take daily care of your teeth and gums.  Stay active. Exercise for at least 30 minutes on 5 or more days each week.  Do not use any products that contain nicotine or tobacco, such as cigarettes, e-cigarettes, and chewing tobacco. If you need help quitting, ask your health care provider.  If you are sexually active, practice safe sex. Use a condom or other form of birth control (contraception) in order to prevent pregnancy and STIs (sexually transmitted infections).  If told by your health care provider, take low-dose aspirin daily starting at age 27. What's next?  Visit your health care provider once a year for a well check visit.  Ask your health care provider how often you should have your eyes and teeth checked.  Stay up to date on all vaccines. This information is not intended to replace advice given to you by your health care provider. Make sure you  discuss any questions you have with your health care provider. Document Released: 10/20/2015 Document Revised: 06/04/2018 Document Reviewed: 06/04/2018 Elsevier Patient Education  2020 [place] self-awareness is knowing how your breasts look and feel. Doing breast self-awareness is important. It allows you to catch a breast problem early while it is still small and can be treated. All women should do breast self-awareness, including women who have had breast implants. Tell your doctor if you notice a change in your breasts. What you need:  A mirror.  A well-lit room. How to do a breast self-exam A breast self-exam is one way to learn what is normal for your breasts and to check for changes. To do a breast self-exam: Look for changes  1. Take off all the clothes above your waist. 2. Stand in front of a mirror in a room with good lighting. 3. Put your hands on your hips. 4. Push your hands down. 5. Look at your breasts and nipples in the mirror to see if one breast or nipple looks  different from the other. Check to see if: ? The shape of one breast is different. ? The size of one breast is different. ? There are wrinkles, dips, and bumps in one breast and not the other. 6. Look at each breast for changes in the skin, such as: ? Redness. ? Scaly areas. 7. Look for changes in your nipples, such as: ? Liquid around the nipples. ? Bleeding. ? Dimpling. ? Redness. ? A change in where the nipples are. Feel for changes  1. Lie on your back on the floor. 2. Feel each breast. To do this, follow these steps: ? Pick a breast to feel. ? Put the arm closest to that breast above your head. ? Use your other arm to feel the nipple area of your breast. Feel the area with the pads of your three middle fingers by making small circles with your fingers. For the first circle, press lightly. For the second circle, press harder. For the third circle, press even harder.  ? Keep making circles with your fingers at the different pressures as you move down your breast. Stop when you feel your ribs. ? Move your fingers a little toward the center of your body. ? Start making circles with your fingers again, this time going up until you reach your collarbone. ? Keep making up-and-down circles until you reach your armpit. Remember to keep using the three pressures. ? Feel the other breast in the same way. 3. Sit or stand in the tub or shower. 4. With soapy water on your skin, feel each breast the same way you did in step 2 when you were lying on the floor. Write down what you find Writing down what you find can help you remember what to tell your doctor. Write down:  What is normal for each breast.  Any changes you find in each breast, including: ? The kind of changes you find. ? Whether you have pain. ? Size and location of any lumps.  When you last had your menstrual period. General tips  Check your breasts every month.  If you are breastfeeding, the best time to check your breasts is after you feed your baby or after you use a breast pump.  If you get menstrual periods, the best time to check your breasts is 5-7 days after your menstrual period is over.  With time, you will become comfortable with the self-exam, and you will begin to know if there are changes in your breasts. Contact a doctor if you:  See a change in the shape or size of your breasts or nipples.  See a change in the skin of your breast or nipples, such as red or scaly skin.  Have fluid coming from your nipples that is not normal.  Find a lump or thick area that was not there before.  Have pain in your breasts.  Have any concerns about your breast health. Summary  Breast self-awareness includes looking for changes in your breasts, as well as feeling for changes within your breasts.  Breast self-awareness should be done in front of a mirror in a well-lit room.  You should  check your breasts every month. If you get menstrual periods, the best time to check your breasts is 5-7 days after your menstrual period is over.  Let your doctor know of any changes you see in your breasts, including changes in size, changes on the skin, pain or tenderness, or fluid from your nipples that is not normal. This  information is not intended to replace advice given to you by your health care provider. Make sure you discuss any questions you have with your health care provider. Document Released: 03/11/2008 Document Revised: 05/12/2018 Document Reviewed: 05/12/2018 Elsevier Patient Education  2020 [employer].

## 2019-05-26 NOTE — Progress Notes (Signed)
ANNUAL PREVENTATIVE CARE GYNECOLOGY  ENCOUNTER NOTE  Subjective:       Charlotte Watkins is a 58 y.o. [id number] female with a h/o uterine fibroids here for a routine annual gynecologic exam. The patient is not sexually active. The patient is taking hormone replacement therapy. Patient denies post-menopausal vaginal bleeding. The patient wears seatbelts: no. The patient participates in regular exercise: no. Has the patient ever been transfused or tattooed?: no. The patient reports that there is not domestic violence in her life.  Current complaints: 1.  Bump outside of labia, has been there for ~ 6 months.  Is not sore or tender, no change noted in size, no drainage.      Gynecologic History Patient's last menstrual period was 03/05/2013. Contraception: post menopausal status Last Pap: 02/2017 neg/neg Results were: normal. Results were: normal Last mammogram: .06/15/2018 Results were: normal Last Colonoscopy: 03/2018.  Results were normal.    Obstetric History OB History  Gravida Para Term Preterm AB Living  3 3 3     3   SAB TAB Ectopic Multiple Live Births          3    # Outcome Date GA Lbr Len/2nd Weight Sex Delivery Anes PTL Lv  3 Term 1990   6 lb 11.2 oz (3.039 kg) M Vag-Spont   LIV  2 Term 1989   8 lb 1.6 oz (3.674 kg) F Vag-Spont   LIV  1 Term 1986   6 lb 14.4 oz (3.13 kg) F Vag-Spont   LIV    Past Medical History:  Diagnosis Date  . Allergic rhinitis, cause unspecified   . Dysmenorrhea   . Excessive or frequent menstruation   . Hypertension    in the past, better after hormone therapy  . Leiomyoma of uterus, unspecified   . Migraine, unspecified, without mention of intractable migraine without mention of status migrainosus    migraines, better after starting hormone therapy  . Palpitations   . Personal history of other genital system and obstetric disorders(V13.29)   . Seizure disorder ([place])   . Unspecified epilepsy without mention of intractable epilepsy    last Sz  over 25 years ago  . Unspecified epilepsy without mention of intractable epilepsy   . Unspecified symptom associated with female genital organs     Family History  Problem Relation Age of Onset  . Heart disease Father   . Heart attack Father   . Diabetes Father   . Parkinson's disease Mother   . Breast cancer Neg Hx   . Ovarian cancer Neg Hx   . Colon cancer Neg Hx     Past Surgical History:  Procedure Laterality Date  . COLONOSCOPY WITH PROPOFOL N/A 04/03/2018   Procedure: COLONOSCOPY WITH PROPOFOL;  Surgeon: Jonathon Bellows, MD;  Location: [facility] ENDOSCOPY;  Service: Gastroenterology;  Laterality: N/A;  . HAND SURGERY    . NO PAST SURGERIES    . SKIN CANCER EXCISION      Social History   Socioeconomic History  . Marital status: Divorced    Spouse name: Not on file  . Number of children: Not on file  . Years of education: Not on file  . Highest education level: Not on file  Occupational History  . Not on file  Social Needs  . Financial resource strain: Not on file  . Food insecurity    Worry: Not on file    Inability: Not on file  . Transportation needs    Medical: Not on  file    Non-medical: Not on file  Tobacco Use  . Smoking status: Never Smoker  . Smokeless tobacco: Never Used  Substance and Sexual Activity  . Alcohol use: Yes    Alcohol/week: 3.0 standard drinks    Types: 3 Glasses of wine per week    Comment: weekly  . Drug use: No  . Sexual activity: Not Currently    Birth control/protection: Post-menopausal  Lifestyle  . Physical activity    Days per week: 3 days    Minutes per session: 60 min  . Stress: Not on file  Relationships  . Social Herbalist on phone: Not on file    Gets together: Not on file    Attends religious service: Not on file    Active member of club or organization: Not on file    Attends meetings of clubs or organizations: Not on file    Relationship status: Not on file  . Intimate partner violence    Fear of current  or ex partner: Not on file    Emotionally abused: Not on file    Physically abused: Not on file    Forced sexual activity: Not on file  Other Topics Concern  . Not on file  Social History Narrative  . Not on file    Current Outpatient Medications on File Prior to Visit  Medication Sig Dispense Refill  . Calcium Carbonate-Vitamin D (CALCIUM + D PO) Take by mouth daily.    Marland Kitchen eletriptan (RELPAX) 40 MG tablet One tablet by mouth at onset of headache. May repeat in 2 hours if headache persists or recurs. may repeat in 2 hours if necessary    . Multiple Vitamin (MULTIVITAMIN) tablet Take 1 tablet by mouth daily.    . norethindrone-ethinyl estradiol (FEMHRT LOW DOSE) 0.5-2.5 MG-MCG tablet Take 1 tablet by mouth daily. 30 tablet 4   No current facility-administered medications on file prior to visit.     Allergies  Allergen Reactions  . Dilantin [Phenytoin Sodium Extended]       Review of Systems ROS Review of Systems - General ROS: negative for - chills, fatigue, fever, hot flashes, night sweats, weight gain or weight loss Psychological ROS: negative for - anxiety, decreased libido, depression, mood swings, physical abuse or sexual abuse Ophthalmic ROS: negative for - blurry vision, eye pain or loss of vision ENT ROS: negative for - headaches, hearing change, visual changes or vocal changes Allergy and Immunology ROS: negative for - hives, itchy/watery eyes or seasonal allergies Hematological and Lymphatic ROS: negative for - bleeding problems, bruising, swollen lymph nodes or weight loss Endocrine ROS: negative for - galactorrhea, hair pattern changes, hot flashes, malaise/lethargy, mood swings, palpitations, polydipsia/polyuria, skin changes, temperature intolerance or unexpected weight changes Breast ROS: negative for - new or changing breast lumps or nipple discharge Respiratory ROS: negative for - cough or shortness of breath Cardiovascular ROS: negative for - chest pain,  irregular heartbeat, palpitations or shortness of breath Gastrointestinal ROS: no abdominal pain, change in bowel habits, or black or bloody stools Genito-Urinary ROS: no dysuria, trouble voiding, or hematuria Musculoskeletal ROS: negative for - joint pain or joint stiffness Neurological ROS: negative for - bowel and bladder control changes Dermatological ROS: negative for rash and skin lesion changes   Objective:   BP (!) 170/106   Pulse 84   Ht 5\' 7"  (1.702 m)   Wt 173 lb 14.4 oz (78.9 kg)   LMP 03/05/2013   BMI 27.24 kg/m  CONSTITUTIONAL: Well-developed, well-nourished female in no acute distress.  PSYCHIATRIC: Normal mood and affect. Normal behavior. Normal judgment and thought content. [place]: Alert and oriented to person, place, and time. Normal muscle tone coordination. No cranial nerve deficit noted. HENT:  Normocephalic, atraumatic, External right and left ear normal. Oropharynx is clear and moist EYES: Conjunctivae and EOM are normal. Pupils are equal, round, and reactive to light. No scleral icterus.  NECK: Normal range of motion, supple, no masses.  Normal thyroid.  SKIN: Skin is warm and dry. No rash noted. Not diaphoretic. No erythema. No pallor. CARDIOVASCULAR: Normal heart rate noted, regular rhythm, no murmur. RESPIRATORY: Clear to auscultation bilaterally. Effort and breath sounds normal, no problems with respiration noted. BREASTS: Symmetric in size. No masses, skin changes, nipple drainage, or lymphadenopathy. ABDOMEN: Soft, normal bowel sounds, no distention noted.  No tenderness, rebound or guarding.  BLADDER: Normal PELVIC:  Bladder no bladder distension noted  Urethra: normal appearing urethra with no masses, tenderness or lesions  Vulva: normal appearing vulva with no masses, tenderness or lesions.  Small inclusion cyst of upper portion of right labia majora.   Vagina: normal appearing vagina with normal color and discharge, no lesions  Cervix: normal  appearing cervix without discharge or lesions  Uterus: uterus is normal size, shape, consistency and nontender  Adnexa: normal adnexa in size, nontender and no masses  RV: External Exam NormaI, No Rectal Masses and Normal Sphincter tone  MUSCULOSKELETAL: Normal range of motion. No tenderness.  No cyanosis, clubbing, or edema.  2+ distal pulses. LYMPHATIC: No Axillary, Supraclavicular, or Inguinal Adenopathy.   Labs: No results found for: WBC, HGB, HCT, MCV, PLT  No results found for: CREATININE, BUN, NA, K, CL, CO2  No results found for: ALT, AST, GGT, ALKPHOS, BILITOT  Lab Results  Component Value Date   CHOL 220 (H) 03/05/2018   HDL 65 03/05/2018   LDLCALC 138 (H) 03/05/2018   TRIG 84 03/05/2018   CHOLHDL 3.4 03/05/2018    Lab Results  Component Value Date   TSH 1.240 03/05/2018    Lab Results  Component Value Date   HGBA1C 5.5 03/05/2018     Assessment:   Encounter for well woman exam with routine gynecological exam Screening for breast cancer  Vaginal atrophy  Vasomotor symptoms due to menopause Uterine leiomyoma, unspecified location Dyslipidemia  Hypertension  Plan:  Pap: Not needed. Due in 1 year.  Mammogram: Ordered Stool Guaiac Testing:  Not Indicated. Patient is up to date on colonoscopy.  Labs: CBC, CMP and Lipid 1 Routine preventative health maintenance measures emphasized: Exercise/Diet/Weight control, Tobacco Warnings, Alcohol/Substance use risks, Stress Management and Safe Sex HTN and Dyslipidemia to be managed by PCP.  Return to Tom Green, MD  [facility]

## 2019-05-26 NOTE — Progress Notes (Signed)
Pt is present today for annual exam. Pt stated that she is doing well no problems.  

## 2019-05-27 LAB — CBC
Hematocrit: 42.1 % (ref 34.0–46.6)
Hemoglobin: 13.6 g/dL (ref 11.1–15.9)
MCH: 31.5 pg (ref 26.6–33.0)
MCHC: 32.3 g/dL (ref 31.5–35.7)
MCV: 98 fL — ABNORMAL HIGH (ref 79–97)
Platelets: 327 10*3/uL (ref 150–450)
RBC: 4.32 x10E6/uL (ref 3.77–5.28)
RDW: 12.4 % (ref 11.7–15.4)
WBC: 6.9 10*3/uL (ref 3.4–10.8)

## 2019-05-27 LAB — COMPREHENSIVE METABOLIC PANEL
ALT: 26 IU/L (ref 0–32)
AST: 17 IU/L (ref 0–40)
Albumin/Globulin Ratio: 2 (ref 1.2–2.2)
Albumin: 4.5 g/dL (ref 3.8–4.9)
Alkaline Phosphatase: 60 IU/L (ref 39–117)
BUN/Creatinine Ratio: 19 (ref 9–23)
BUN: 13 mg/dL (ref 6–24)
Bilirubin Total: 0.4 mg/dL (ref 0.0–1.2)
CO2: 23 mmol/L (ref 20–29)
Calcium: 9.4 mg/dL (ref 8.7–10.2)
Chloride: 105 mmol/L (ref 96–106)
Creatinine, Ser: 0.69 mg/dL (ref 0.57–1.00)
GFR calc Af Amer: 111 mL/min/{1.73_m2} (ref >59)
GFR calc non Af Amer: 96 mL/min/{1.73_m2} (ref >59)
Globulin, Total: 2.3 g/dL (ref 1.5–4.5)
Glucose: 69 mg/dL (ref 65–99)
Potassium: 5.3 mmol/L — ABNORMAL HIGH (ref 3.5–5.2)
Sodium: 142 mmol/L (ref 134–144)
Total Protein: 6.8 g/dL (ref 6.0–8.5)

## 2019-05-27 LAB — LIPID PANEL
Chol/HDL Ratio: 3.1 ratio (ref 0.0–4.4)
Cholesterol, Total: 249 mg/dL — ABNORMAL HIGH (ref 100–199)
HDL: 80 mg/dL (ref >39)
LDL Calculated: 155 mg/dL — ABNORMAL HIGH (ref 0–99)
Triglycerides: 71 mg/dL (ref 0–149)
VLDL Cholesterol Cal: 14 mg/dL (ref 5–40)

## 2019-07-29 ENCOUNTER — Other Ambulatory Visit: Payer: Self-pay | Admitting: Obstetrics and Gynecology

## 2020-05-26 ENCOUNTER — Encounter: Payer: No Typology Code available for payment source | Admitting: Obstetrics and Gynecology

## 2020-07-07 ENCOUNTER — Ambulatory Visit
Admission: RE | Admit: 2020-07-07 | Discharge: 2020-07-07 | Disposition: A | Discharge status: Home / Self Care | Payer: No Typology Code available for payment source | Source: Ambulatory Visit | Attending: Obstetrics and Gynecology | Admitting: Obstetrics and Gynecology

## 2020-07-07 ENCOUNTER — Other Ambulatory Visit: Payer: Self-pay

## 2020-07-07 DIAGNOSIS — Z1231 Encounter for screening mammogram for malignant neoplasm of breast: Secondary | ICD-10-CM | POA: Insufficient documentation

## 2020-07-07 DIAGNOSIS — Z1239 Encounter for other screening for malignant neoplasm of breast: Secondary | ICD-10-CM

## 2020-07-07 DIAGNOSIS — Z1331 Encounter for screening for depression: Secondary | ICD-10-CM | POA: Diagnosis present

## 2020-07-10 ENCOUNTER — Other Ambulatory Visit: Payer: Self-pay | Admitting: Obstetrics and Gynecology

## 2020-07-10 NOTE — Telephone Encounter (Signed)
Patient needs annual exam prior to next refill.  

## 2020-07-12 ENCOUNTER — Ambulatory Visit (INDEPENDENT_AMBULATORY_CARE_PROVIDER_SITE_OTHER): Payer: No Typology Code available for payment source | Admitting: Obstetrics and Gynecology

## 2020-07-12 ENCOUNTER — Other Ambulatory Visit: Payer: Self-pay

## 2020-07-12 ENCOUNTER — Encounter: Payer: Self-pay | Admitting: Obstetrics and Gynecology

## 2020-07-12 ENCOUNTER — Other Ambulatory Visit (HOSPITAL_COMMUNITY)
Admission: RE | Admit: 2020-07-12 | Discharge: 2020-07-12 | Disposition: A | Discharge status: Home / Self Care | Payer: No Typology Code available for payment source | Source: Ambulatory Visit | Attending: Obstetrics and Gynecology | Admitting: Obstetrics and Gynecology

## 2020-07-12 VITALS — BP 126/81 | HR 87 | Ht 67.0 in | Wt 174.2 lb

## 2020-07-12 DIAGNOSIS — N951 Menopausal and female climacteric states: Secondary | ICD-10-CM | POA: Diagnosis not present

## 2020-07-12 DIAGNOSIS — Z01419 Encounter for gynecological examination (general) (routine) without abnormal findings: Secondary | ICD-10-CM

## 2020-07-12 DIAGNOSIS — Z124 Encounter for screening for malignant neoplasm of cervix: Secondary | ICD-10-CM | POA: Insufficient documentation

## 2020-07-12 DIAGNOSIS — Z79890 Hormone replacement therapy (postmenopausal): Secondary | ICD-10-CM

## 2020-07-12 DIAGNOSIS — Z23 Encounter for immunization: Secondary | ICD-10-CM | POA: Diagnosis not present

## 2020-07-12 DIAGNOSIS — N952 Postmenopausal atrophic vaginitis: Secondary | ICD-10-CM

## 2020-07-12 DIAGNOSIS — Z1231 Encounter for screening mammogram for malignant neoplasm of breast: Secondary | ICD-10-CM

## 2020-07-12 NOTE — Progress Notes (Signed)
Pt present for annual exam. Pt stated that she was doing well. Flu vaccine administered today.

## 2020-07-12 NOTE — Patient Instructions (Addendum)
Influenza (Flu) Vaccine (Inactivated or Recombinant): What You Need to Know 1. Why get vaccinated? Influenza vaccine can prevent influenza (flu). Flu is a contagious disease that spreads around the [place] every year, usually between October and May. Anyone can get the flu, but it is more dangerous for some people. Infants and young children, people 60 years of age and older, pregnant women, and people with certain health conditions or a weakened immune system are at greatest risk of flu complications. Pneumonia, bronchitis, sinus infections and ear infections are examples of flu-related complications. If you have a medical condition, such as heart disease, cancer or diabetes, flu can make it worse. Flu can cause fever and chills, sore throat, muscle aches, fatigue, cough, headache, and runny or stuffy nose. Some people may have vomiting and diarrhea, though this is more common in children than adults. Each year thousands of people in the [place] die from flu, and many more are hospitalized. Flu vaccine prevents millions of illnesses and flu-related visits to the doctor each year. 2. Influenza vaccine CDC recommends everyone 57 months of age and older get vaccinated every flu season. Children 6 months through 2 years of age may need 2 doses during a single flu season. Everyone else needs only 1 dose each flu season. It takes about 2 weeks for protection to develop after vaccination. There are many flu viruses, and they are always changing. Each year a new flu vaccine is made to protect against three or four viruses that are likely to cause disease in the upcoming flu season. Even when the vaccine doesn't exactly match these viruses, it may still provide some protection. Influenza vaccine does not cause flu. Influenza vaccine may be given at the same time as other vaccines. 3. Talk with your health care provider Tell your vaccine provider if the person getting the vaccine:  Has had an  allergic reaction after a previous dose of influenza vaccine, or has any severe, life-threatening allergies.  Has ever had Guillain-Barr Syndrome (also called GBS). In some cases, your health care provider may decide to postpone influenza vaccination to a future visit. People with minor illnesses, such as a cold, may be vaccinated. People who are moderately or severely ill should usually wait until they recover before getting influenza vaccine. Your health care provider can give you more information. 4. Risks of a vaccine reaction  Soreness, redness, and swelling where shot is given, fever, muscle aches, and headache can happen after influenza vaccine.  There may be a very small increased risk of Guillain-Barr Syndrome (GBS) after inactivated influenza vaccine (the flu shot). Young children who get the flu shot along with pneumococcal vaccine (PCV13), and/or DTaP vaccine at the same time might be slightly more likely to have a seizure caused by fever. Tell your health care provider if a child who is getting flu vaccine has ever had a seizure. People sometimes faint after medical procedures, including vaccination. Tell your provider if you feel dizzy or have vision changes or ringing in the ears. As with any medicine, there is a very remote chance of a vaccine causing a severe allergic reaction, other serious injury, or death. 5. What if there is a serious problem? An allergic reaction could occur after the vaccinated person leaves the clinic. If you see signs of a severe allergic reaction (hives, swelling of the face and throat, difficulty breathing, a fast heartbeat, dizziness, or weakness), call 9-1-1 and get the person to the nearest hospital. For other signs that  concern you, call your health care provider. Adverse reactions should be reported to the Vaccine Adverse Event Reporting System (VAERS). Your health care provider will usually file this report, or you can do it yourself. Visit the  VAERS website at www.vaers.SamedayNews.es or call 647 459 2503.VAERS is only for reporting reactions, and VAERS staff do not give medical advice. 6. The National Vaccine Injury Compensation Program The [employer] Vaccine Injury Compensation Program (VICP) is a federal program that was created to compensate people who may have been injured by certain vaccines. Visit the VICP website at GoldCloset.com.ee or call 279-341-0239 to learn about the program and about filing a claim. There is a time limit to file a claim for compensation. 7. How can I learn more?  Ask your healthcare provider.  Call your local or state health department.  Contact the Centers for Disease Control and Prevention (CDC): ? Call (340) 878-2446 (1-800-CDC-INFO) or ? Visit CDC's https://gibson.com/ Vaccine Information Statement (Interim) Inactivated Influenza Vaccine (05/21/2018) This information is not intended to replace advice given to you by your health care provider. Make sure you discuss any questions you have with your health care provider. Document Revised: 01/12/2019 Document Reviewed: 05/25/2018 Elsevier Patient Education  2020 [place] Breast self-awareness is knowing how your breasts look and feel. Doing breast self-awareness is important. It allows you to catch a breast problem early while it is still small and can be treated. All women should do breast self-awareness, including women who have had breast implants. Tell your doctor if you notice a change in your breasts. What you need:  A mirror.  A well-lit room. How to do a breast self-exam A breast self-exam is one way to learn what is normal for your breasts and to check for changes. To do a breast self-exam: Look for changes  1. Take off all the clothes above your waist. 2. Stand in front of a mirror in a room with good lighting. 3. Put your hands on your hips. 4. Push your hands down. 5. Look at your breasts and  nipples in the mirror to see if one breast or nipple looks different from the other. Check to see if: ? The shape of one breast is different. ? The size of one breast is different. ? There are wrinkles, dips, and bumps in one breast and not the other. 6. Look at each breast for changes in the skin, such as: ? Redness. ? Scaly areas. 7. Look for changes in your nipples, such as: ? Liquid around the nipples. ? Bleeding. ? Dimpling. ? Redness. ? A change in where the nipples are. Feel for changes  1. Lie on your back on the floor. 2. Feel each breast. To do this, follow these steps: ? Pick a breast to feel. ? Put the arm closest to that breast above your head. ? Use your other arm to feel the nipple area of your breast. Feel the area with the pads of your three middle fingers by making small circles with your fingers. For the first circle, press lightly. For the second circle, press harder. For the third circle, press even harder. ? Keep making circles with your fingers at the different pressures as you move down your breast. Stop when you feel your ribs. ? Move your fingers a little toward the center of your body. ? Start making circles with your fingers again, this time going up until you reach your collarbone. ? Keep making up-and-down circles until you reach your armpit. Remember to  keep using the three pressures. ? Feel the other breast in the same way. 3. Sit or stand in the tub or shower. 4. With soapy water on your skin, feel each breast the same way you did in step 2 when you were lying on the floor. Write down what you find Writing down what you find can help you remember what to tell your doctor. Write down:  What is normal for each breast.  Any changes you find in each breast, including: ? The kind of changes you find. ? Whether you have pain. ? Size and location of any lumps.  When you last had your menstrual period. General tips  Check your breasts every  month.  If you are breastfeeding, the best time to check your breasts is after you feed your baby or after you use a breast pump.  If you get menstrual periods, the best time to check your breasts is 5-7 days after your menstrual period is over.  With time, you will become comfortable with the self-exam, and you will begin to know if there are changes in your breasts. Contact a doctor if you:  See a change in the shape or size of your breasts or nipples.  See a change in the skin of your breast or nipples, such as red or scaly skin.  Have fluid coming from your nipples that is not normal.  Find a lump or thick area that was not there before.  Have pain in your breasts.  Have any concerns about your breast health. Summary  Breast self-awareness includes looking for changes in your breasts, as well as feeling for changes within your breasts.  Breast self-awareness should be done in front of a mirror in a well-lit room.  You should check your breasts every month. If you get menstrual periods, the best time to check your breasts is 5-7 days after your menstrual period is over.  Let your doctor know of any changes you see in your breasts, including changes in size, changes on the skin, pain or tenderness, or fluid from your nipples that is not normal. This information is not intended to replace advice given to you by your health care provider. Make sure you discuss any questions you have with your health care provider. Document Revised: 05/12/2018 Document Reviewed: 05/12/2018 Elsevier Patient Education  2020 [place] Breast self-awareness is knowing how your breasts look and feel. Doing breast self-awareness is important. It allows you to catch a breast problem early while it is still small and can be treated. All women should do breast self-awareness, including women who have had breast implants. Tell your doctor if you notice a change in your  breasts. What you need:  A mirror.  A well-lit room. How to do a breast self-exam A breast self-exam is one way to learn what is normal for your breasts and to check for changes. To do a breast self-exam: Look for changes  8. Take off all the clothes above your waist. 9. Stand in front of a mirror in a room with good lighting. 10. Put your hands on your hips. 11. Push your hands down. 12. Look at your breasts and nipples in the mirror to see if one breast or nipple looks different from the other. Check to see if: ? The shape of one breast is different. ? The size of one breast is different. ? There are wrinkles, dips, and bumps in one breast and not the other.  13. Look at each breast for changes in the skin, such as: ? Redness. ? Scaly areas. 14. Look for changes in your nipples, such as: ? Liquid around the nipples. ? Bleeding. ? Dimpling. ? Redness. ? A change in where the nipples are. Feel for changes  5. Lie on your back on the floor. 6. Feel each breast. To do this, follow these steps: ? Pick a breast to feel. ? Put the arm closest to that breast above your head. ? Use your other arm to feel the nipple area of your breast. Feel the area with the pads of your three middle fingers by making small circles with your fingers. For the first circle, press lightly. For the second circle, press harder. For the third circle, press even harder. ? Keep making circles with your fingers at the different pressures as you move down your breast. Stop when you feel your ribs. ? Move your fingers a little toward the center of your body. ? Start making circles with your fingers again, this time going up until you reach your collarbone. ? Keep making up-and-down circles until you reach your armpit. Remember to keep using the three pressures. ? Feel the other breast in the same way. 7. Sit or stand in the tub or shower. 8. With soapy water on your skin, feel each breast the same way you did in  step 2 when you were lying on the floor. Write down what you find Writing down what you find can help you remember what to tell your doctor. Write down:  What is normal for each breast.  Any changes you find in each breast, including: ? The kind of changes you find. ? Whether you have pain. ? Size and location of any lumps.  When you last had your menstrual period. General tips  Check your breasts every month.  If you are breastfeeding, the best time to check your breasts is after you feed your baby or after you use a breast pump.  If you get menstrual periods, the best time to check your breasts is 5-7 days after your menstrual period is over.  With time, you will become comfortable with the self-exam, and you will begin to know if there are changes in your breasts. Contact a doctor if you:  See a change in the shape or size of your breasts or nipples.  See a change in the skin of your breast or nipples, such as red or scaly skin.  Have fluid coming from your nipples that is not normal.  Find a lump or thick area that was not there before.  Have pain in your breasts.  Have any concerns about your breast health. Summary  Breast self-awareness includes looking for changes in your breasts, as well as feeling for changes within your breasts.  Breast self-awareness should be done in front of a mirror in a well-lit room.  You should check your breasts every month. If you get menstrual periods, the best time to check your breasts is 5-7 days after your menstrual period is over.  Let your doctor know of any changes you see in your breasts, including changes in size, changes on the skin, pain or tenderness, or fluid from your nipples that is not normal. This information is not intended to replace advice given to you by your health care provider. Make sure you discuss any questions you have with your health care provider. Document Revised: 05/12/2018 Document Reviewed:  05/12/2018 Elsevier Patient Education  2020 [employer] Inc[employer]  

## 2020-07-12 NOTE — Progress Notes (Signed)
ANNUAL PREVENTATIVE CARE GYNECOLOGY  ENCOUNTER NOTE  Subjective:       Charlotte Watkins is a 59 y.o. G66P3003 female here for a routine annual gynecologic exam. The patient is not sexually active. The patient is taking hormone replacement therapy. Patient denies post-menopausal vaginal bleeding. The patient wears seatbelts: no. The patient participates in regular exercise: yes. Has the patient ever been transfused or tattooed?: no. The patient reports that there is not domestic violence in her life.  Current complaints: 1.  She inquires as to how long it is to safely take HRT. She also has complaints about the cost of her current HRT [facility]).  Notes that currently it is $65/month. Lastly, she reports that she is still having some breakthrough hot flushes on her current dose.    Gynecologic History Patient's last menstrual period was 03/05/2013. Contraception: post menopausal status Last Pap: 02/2017 neg/neg Results were: normal. Results were: normal Last mammogram: .07/07/2020. Results were: normal Last Colonoscopy: 03/2018.  Results were normal.    Obstetric History OB History  Gravida Para Term Preterm AB Living  3 3 3     3   SAB TAB Ectopic Multiple Live Births          3    # Outcome Date GA Lbr Len/2nd Weight Sex Delivery Anes PTL Lv  3 Term 1990   6 lb 11.2 oz (3.039 kg) M Vag-Spont   LIV  2 Term 1989   8 lb 1.6 oz (3.674 kg) F Vag-Spont   LIV  1 Term 1986   6 lb 14.4 oz (3.13 kg) F Vag-Spont   LIV    Past Medical History:  Diagnosis Date  . Allergic rhinitis, cause unspecified   . Dysmenorrhea   . Excessive or frequent menstruation   . Hypertension    in the past, better after hormone therapy  . Leiomyoma of uterus, unspecified   . Migraine, unspecified, without mention of intractable migraine without mention of status migrainosus    migraines, better after starting hormone therapy  . Palpitations   . Personal history of other genital system and obstetric  disorders(V13.29)   . Seizure disorder ([place])   . Unspecified epilepsy without mention of intractable epilepsy    last Sz over 25 years ago  . Unspecified epilepsy without mention of intractable epilepsy   . Unspecified symptom associated with female genital organs     Family History  Problem Relation Age of Onset  . Heart disease Father   . Heart attack Father   . Diabetes Father   . Parkinson's disease Mother   . Breast cancer Neg Hx   . Ovarian cancer Neg Hx   . Colon cancer Neg Hx     Past Surgical History:  Procedure Laterality Date  . COLONOSCOPY WITH PROPOFOL N/A 04/03/2018   Procedure: COLONOSCOPY WITH PROPOFOL;  Surgeon: Jonathon Bellows, MD;  Location: [facility] ENDOSCOPY;  Service: Gastroenterology;  Laterality: N/A;  . HAND SURGERY    . NO PAST SURGERIES    . SKIN CANCER EXCISION      Social History   Socioeconomic History  . Marital status: Divorced    Spouse name: Not on file  . Number of children: Not on file  . Years of education: Not on file  . Highest education level: Not on file  Occupational History  . Not on file  Tobacco Use  . Smoking status: Never Smoker  . Smokeless tobacco: Never Used  Vaping Use  . Vaping Use: Never  used  Substance and Sexual Activity  . Alcohol use: Yes    Alcohol/week: 3.0 standard drinks    Types: 3 Glasses of wine per week    Comment: weekly  . Drug use: No  . Sexual activity: Not Currently    Birth control/protection: Post-menopausal  Other Topics Concern  . Not on file  Social History Narrative  . Not on file   Social Determinants of Health   Financial Resource Strain:   . Difficulty of Paying Living Expenses: Not on file  Food Insecurity:   . Worried About Charity fundraiser in the Last Year: Not on file  . Ran Out of Food in the Last Year: Not on file  Transportation Needs:   . Lack of Transportation (Medical): Not on file  . Lack of Transportation (Non-Medical): Not on file  Physical Activity:   . Days of  Exercise per Week: Not on file  . Minutes of Exercise per Session: Not on file  Stress:   . Feeling of Stress : Not on file  Social Connections:   . Frequency of Communication with Friends and Family: Not on file  . Frequency of Social Gatherings with Friends and Family: Not on file  . Attends Religious Services: Not on file  . Active Member of Clubs or Organizations: Not on file  . Attends Archivist Meetings: Not on file  . Marital Status: Not on file  Intimate Partner Violence:   . Fear of Current or Ex-Partner: Not on file  . Emotionally Abused: Not on file  . Physically Abused: Not on file  . Sexually Abused: Not on file    Current Outpatient Medications on File Prior to Visit  Medication Sig Dispense Refill  . Calcium Carbonate-Vitamin D (CALCIUM + D PO) Take by mouth daily.    Marland Kitchen eletriptan (RELPAX) 40 MG tablet One tablet by mouth at onset of headache. May repeat in 2 hours if headache persists or recurs. may repeat in 2 hours if necessary    . losartan (COZAAR) 25 MG tablet Take 25 mg by mouth daily.    . Multiple Vitamin (MULTIVITAMIN) tablet Take 1 tablet by mouth daily.    . norethindrone-ethinyl estradiol (FEMHRT LOW DOSE) 0.5-2.5 MG-MCG tablet TAKE 1 TABLET BY MOUTH EVERY DAY 28 tablet 2   No current facility-administered medications on file prior to visit.    Allergies  Allergen Reactions  . Dilantin [Phenytoin Sodium Extended]       Review of Systems ROS Review of Systems - General ROS: negative for - chills, fatigue, fever, weight gain or weight loss.  Positive for hot flashes, night sweats, Psychological ROS: negative for - anxiety, decreased libido, depression, mood swings, physical abuse or sexual abuse Ophthalmic ROS: negative for - blurry vision, eye pain or loss of vision ENT ROS: negative for - headaches, hearing change, visual changes or vocal changes Allergy and Immunology ROS: negative for - hives, itchy/watery eyes or seasonal  allergies Hematological and Lymphatic ROS: negative for - bleeding problems, bruising, swollen lymph nodes or weight loss Endocrine ROS: negative for - galactorrhea, hair pattern changes, hot flashes, malaise/lethargy, mood swings, palpitations, polydipsia/polyuria, skin changes, temperature intolerance or unexpected weight changes Breast ROS: negative for - new or changing breast lumps or nipple discharge Respiratory ROS: negative for - cough or shortness of breath Cardiovascular ROS: negative for - chest pain, irregular heartbeat, palpitations or shortness of breath Gastrointestinal ROS: no abdominal pain, change in bowel habits, or black or bloody  stools Genito-Urinary ROS: no dysuria, trouble voiding, or hematuria Musculoskeletal ROS: negative for - joint pain or joint stiffness Neurological ROS: negative for - bowel and bladder control changes Dermatological ROS: negative for rash and skin lesion changes   Objective:   BP 126/81   Pulse 87   Ht 5\' 7"  (1.702 m)   Wt 174 lb 3.2 oz (79 kg)   LMP 03/05/2013   BMI 27.28 kg/m  CONSTITUTIONAL: Well-developed, well-nourished female in no acute distress.  PSYCHIATRIC: Normal mood and affect. Normal behavior. Normal judgment and thought content. [place]: Alert and oriented to person, place, and time. Normal muscle tone coordination. No cranial nerve deficit noted. HENT:  Normocephalic, atraumatic, External right and left ear normal. Oropharynx is clear and moist EYES: Conjunctivae and EOM are normal. Pupils are equal, round, and reactive to light. No scleral icterus.  NECK: Normal range of motion, supple, no masses.  Normal thyroid.  SKIN: Skin is warm and dry. No rash noted. Not diaphoretic. No erythema. No pallor. CARDIOVASCULAR: Normal heart rate noted, regular rhythm, no murmur. RESPIRATORY: Clear to auscultation bilaterally. Effort and breath sounds normal, no problems with respiration noted. BREASTS: Symmetric in size. No masses,  skin changes, nipple drainage, or lymphadenopathy. ABDOMEN: Soft, normal bowel sounds, no distention noted.  No tenderness, rebound or guarding.  BLADDER: Normal PELVIC:  Bladder no bladder distension noted  Urethra: normal appearing urethra with no masses, tenderness or lesions  Vulva: normal appearing vulva with no masses, tenderness or lesions.    Vagina: normal appearing vagina with mild atrophy present at introitus, no discharge, no lesions  Cervix: normal appearing cervix without discharge or lesions  Uterus: uterus is normal size, shape, consistency and nontender  Adnexa: normal adnexa in size, nontender and no masses  RV: External Exam NormaI, No Rectal Masses and Normal Sphincter tone  MUSCULOSKELETAL: Normal range of motion. No tenderness.  No cyanosis, clubbing, or edema.  2+ distal pulses. LYMPHATIC: No Axillary, Supraclavicular, or Inguinal Adenopathy.   Labs: Lab Results  Component Value Date   WBC 6.9 05/26/2019   HGB 13.6 05/26/2019   HCT 42.1 05/26/2019   MCV 98 (H) 05/26/2019   PLT 327 05/26/2019    Lab Results  Component Value Date   CREATININE 0.69 05/26/2019   BUN 13 05/26/2019   NA 142 05/26/2019   K 5.3 (H) 05/26/2019   CL 105 05/26/2019   CO2 23 05/26/2019    Lab Results  Component Value Date   ALT 26 05/26/2019   AST 17 05/26/2019   ALKPHOS 60 05/26/2019   BILITOT 0.4 05/26/2019    Lab Results  Component Value Date   CHOL 249 (H) 05/26/2019   HDL 80 05/26/2019   LDLCALC 155 (H) 05/26/2019   TRIG 71 05/26/2019   CHOLHDL 3.1 05/26/2019    Lab Results  Component Value Date   TSH 1.240 03/05/2018    Lab Results  Component Value Date   HGBA1C 5.5 03/05/2018     Assessment:   Encounter for well woman exam with routine gynecological exam Screening for breast cancer  Vaginal atrophy  Menopausal symptoms on HRT Dyslipidemia  Hypertension  Plan:  - Pap: Pap Co Test   - Mammogram: Up to date. COntinue routine screening.  -  Stool Guaiac Testing:  Not Indicated. Patient is up to date on colonoscopy.  - Labs: CBC, CMP and Lipid 1 - Routine preventative health maintenance measures emphasized: Exercise/Diet/Weight control, Tobacco Warnings, Alcohol/Substance use risks, Stress Management and Safe Sex  -  Encouraged to continue calcium and Vitamin D supplementation.  - HTN and Dyslipidemia to be managed by PCP.  - Patient has a 3 year history of HRT exposure.  Discussed risks of increased risk of thrombo-embolic events such as myocardial infarction stroke and breast cancer after 4 or more years exposure to combination products with estrogen and progesterone. She understands the benefits as well as the risks discussed above. She  is advised that she may wish to discontinue the HRT at any time. Currently experiencing breakthrough symptoms on current HRT, along with issues with cost. Discussed option of changing to a different medication brand that may be better covered by her insurance iI.e. Activella) and can consider an increased dosing due to her breakthrough symptoms.  Patient to notify if new prescription desired.   - COVID vaccination status: patient has completed vaccination series. To consider booster once eligible.  - Flu vaccine given today.  - Return to [place], MD  [facility]

## 2020-07-17 LAB — CYTOLOGY - PAP
Comment: NEGATIVE
Diagnosis: NEGATIVE
High risk HPV: NEGATIVE

## 2020-07-24 ENCOUNTER — Other Ambulatory Visit: Payer: No Typology Code available for payment source

## 2020-08-10 ENCOUNTER — Other Ambulatory Visit: Payer: No Typology Code available for payment source

## 2020-08-17 ENCOUNTER — Other Ambulatory Visit: Payer: No Typology Code available for payment source

## 2020-08-23 ENCOUNTER — Other Ambulatory Visit: Payer: No Typology Code available for payment source

## 2020-09-07 ENCOUNTER — Other Ambulatory Visit: Payer: No Typology Code available for payment source

## 2020-09-07 ENCOUNTER — Other Ambulatory Visit: Payer: Self-pay

## 2020-09-08 LAB — COMPREHENSIVE METABOLIC PANEL
ALT: 18 IU/L (ref 0–32)
AST: 18 IU/L (ref 0–40)
Albumin/Globulin Ratio: 2 (ref 1.2–2.2)
Albumin: 4.5 g/dL (ref 3.8–4.9)
Alkaline Phosphatase: 57 IU/L (ref 44–121)
BUN/Creatinine Ratio: 15 (ref 9–23)
BUN: 12 mg/dL (ref 6–24)
Bilirubin Total: 0.8 mg/dL (ref 0.0–1.2)
CO2: 22 mmol/L (ref 20–29)
Calcium: 9.3 mg/dL (ref 8.7–10.2)
Chloride: 103 mmol/L (ref 96–106)
Creatinine, Ser: 0.81 mg/dL (ref 0.57–1.00)
GFR calc Af Amer: 92 mL/min/{1.73_m2} (ref >59)
GFR calc non Af Amer: 80 mL/min/{1.73_m2} (ref >59)
Globulin, Total: 2.3 g/dL (ref 1.5–4.5)
Glucose: 88 mg/dL (ref 65–99)
Potassium: 4.7 mmol/L (ref 3.5–5.2)
Sodium: 140 mmol/L (ref 134–144)
Total Protein: 6.8 g/dL (ref 6.0–8.5)

## 2020-09-08 LAB — CBC
Hematocrit: 41.4 % (ref 34.0–46.6)
Hemoglobin: 13.6 g/dL (ref 11.1–15.9)
MCH: 31.5 pg (ref 26.6–33.0)
MCHC: 32.9 g/dL (ref 31.5–35.7)
MCV: 96 fL (ref 79–97)
Platelets: 305 10*3/uL (ref 150–450)
RBC: 4.32 x10E6/uL (ref 3.77–5.28)
RDW: 12.5 % (ref 11.7–15.4)
WBC: 6.2 10*3/uL (ref 3.4–10.8)

## 2020-09-08 LAB — TSH: TSH: 1.38 u[IU]/mL (ref 0.450–4.500)

## 2020-09-08 LAB — LIPID PANEL
Chol/HDL Ratio: 3.3 ratio (ref 0.0–4.4)
Cholesterol, Total: 241 mg/dL — ABNORMAL HIGH (ref 100–199)
HDL: 73 mg/dL (ref >39)
LDL Chol Calc (NIH): 153 mg/dL — ABNORMAL HIGH (ref 0–99)
Triglycerides: 85 mg/dL (ref 0–149)
VLDL Cholesterol Cal: 15 mg/dL (ref 5–40)

## 2020-09-08 LAB — HEMOGLOBIN A1C
Est. average glucose Bld gHb Est-mCnc: 114 mg/dL
Hgb A1c MFr Bld: 5.6 % (ref 4.8–5.6)

## 2020-09-28 ENCOUNTER — Other Ambulatory Visit: Payer: Self-pay | Admitting: Obstetrics and Gynecology

## 2020-09-28 NOTE — Telephone Encounter (Signed)
Pt called no answer LM via VM that I was calling due to getting a medication refill for Femhrt and noticed in [facility] last note that she wanted to possibly try something different due to the cost of Femhrt. Was calling to see if she wanted me to refill Femhrt or send request to [facility] for another HRT medication.

## 2020-10-02 NOTE — Telephone Encounter (Signed)
Hello Dr. Valentino Saxon, Pt would like to try another form of HRT due to the cost of Femhrt. I think I sent you this message through a mychart message but it will not go away in the rx request area. Thanks [employer]

## 2021-07-25 ENCOUNTER — Other Ambulatory Visit: Payer: Self-pay

## 2021-07-25 ENCOUNTER — Ambulatory Visit (INDEPENDENT_AMBULATORY_CARE_PROVIDER_SITE_OTHER): Payer: No Typology Code available for payment source | Admitting: Obstetrics and Gynecology

## 2021-07-25 ENCOUNTER — Encounter: Payer: Self-pay | Admitting: Obstetrics and Gynecology

## 2021-07-25 VITALS — BP 143/81 | HR 87 | Ht 67.0 in | Wt 177.6 lb

## 2021-07-25 DIAGNOSIS — Z1231 Encounter for screening mammogram for malignant neoplasm of breast: Secondary | ICD-10-CM

## 2021-07-25 DIAGNOSIS — Z23 Encounter for immunization: Secondary | ICD-10-CM | POA: Diagnosis not present

## 2021-07-25 DIAGNOSIS — E785 Hyperlipidemia, unspecified: Secondary | ICD-10-CM

## 2021-07-25 DIAGNOSIS — Z01419 Encounter for gynecological examination (general) (routine) without abnormal findings: Secondary | ICD-10-CM | POA: Diagnosis not present

## 2021-07-25 DIAGNOSIS — N952 Postmenopausal atrophic vaginitis: Secondary | ICD-10-CM

## 2021-07-25 DIAGNOSIS — I10 Essential (primary) hypertension: Secondary | ICD-10-CM

## 2021-07-25 DIAGNOSIS — N951 Menopausal and female climacteric states: Secondary | ICD-10-CM

## 2021-07-25 DIAGNOSIS — Z79890 Hormone replacement therapy (postmenopausal): Secondary | ICD-10-CM

## 2021-07-25 NOTE — Progress Notes (Signed)
ANNUAL PREVENTATIVE CARE GYNECOLOGY  ENCOUNTER NOTE  Subjective:       Charlotte Watkins is a 60 y.o. G37P3003 female here for a routine annual gynecologic exam. The patient is not sexually active. The patient is taking hormone replacement therapy (4 years). Patient denies post-menopausal vaginal bleeding. The patient wears seatbelts: no. The patient participates in regular exercise: yes. Has the patient ever been transfused or tattooed?: no. The patient reports that there is not domestic violence in her life.  Current complaints: 1.  Patient is currently taking Femhrt and her PCP increased the dose the dose ~ 2 months ago due to her complaints of not sleeping well. Patient notes that she did not necessarily desire the increase in dose, but it was cheaper for her. Patient would like to discuss her options. Wonders what would happen if she decided to just stop taking the medication.    Gynecologic History Patient's last menstrual period was 03/05/2013. Contraception: post menopausal status Last Pap:07/12/2020 neg/neg Results were: normal. Results were: normal Last mammogram: .07/07/2020. Results were: normal Last Colonoscopy: 03/2018.  Results were normal.  Last Dexa Scan: Patient has never had one.    Obstetric History OB History  Gravida Para Term Preterm AB Living  3 3 3     3   SAB IAB Ectopic Multiple Live Births          3    # Outcome Date GA Lbr Len/2nd Weight Sex Delivery Anes PTL Lv  3 Term 1990   6 lb 11.2 oz (3.039 kg) M Vag-Spont   LIV  2 Term 1989   8 lb 1.6 oz (3.674 kg) F Vag-Spont   LIV  1 Term 1986   6 lb 14.4 oz (3.13 kg) F Vag-Spont   LIV    Past Medical History:  Diagnosis Date   Allergic rhinitis, cause unspecified    Dysmenorrhea    Excessive or frequent menstruation    Hypertension    in the past, better after hormone therapy   Leiomyoma of uterus, unspecified    Migraine, unspecified, without mention of intractable migraine without mention of status  migrainosus    migraines, better after starting hormone therapy   Palpitations    Personal history of other genital system and obstetric disorders(V13.29)    Seizure disorder ([place])    Unspecified epilepsy without mention of intractable epilepsy    last Sz over 25 years ago   Unspecified epilepsy without mention of intractable epilepsy    Unspecified symptom associated with female genital organs     Family History  Problem Relation Age of Onset   Heart disease Father    Heart attack Father    Diabetes Father    Parkinson's disease Mother    Breast cancer Neg Hx    Ovarian cancer Neg Hx    Colon cancer Neg Hx     Past Surgical History:  Procedure Laterality Date   COLONOSCOPY WITH PROPOFOL N/A 04/03/2018   Procedure: COLONOSCOPY WITH PROPOFOL;  Surgeon: Jonathon Bellows, MD;  Location: [facility] ENDOSCOPY;  Service: Gastroenterology;  Laterality: N/A;   HAND SURGERY     NO PAST SURGERIES     SKIN CANCER EXCISION      Social History   Socioeconomic History   Marital status: Divorced    Spouse name: Not on file   Number of children: Not on file   Years of education: Not on file   Highest education level: Not on file  Occupational History  Not on file  Tobacco Use   Smoking status: Never   Smokeless tobacco: Never  Vaping Use   Vaping Use: Never used  Substance and Sexual Activity   Alcohol use: Yes    Alcohol/week: 3.0 standard drinks    Types: 3 Glasses of wine per week    Comment: weekly   Drug use: No   Sexual activity: Not Currently    Birth control/protection: Post-menopausal  Other Topics Concern   Not on file  Social History Narrative   Not on file   Social Determinants of Health   Financial Resource Strain: Not on file  Food Insecurity: Not on file  Transportation Needs: Not on file  Physical Activity: Not on file  Stress: Not on file  Social Connections: Not on file  Intimate Partner Violence: Not on file    Current Outpatient Medications on File Prior  to Visit  Medication Sig Dispense Refill   Calcium Carbonate-Vitamin D (CALCIUM + D PO) Take by mouth daily.     eletriptan (RELPAX) 40 MG tablet One tablet by mouth at onset of headache. May repeat in 2 hours if headache persists or recurs. may repeat in 2 hours if necessary     Multiple Vitamin (MULTIVITAMIN) tablet Take 1 tablet by mouth daily.     norethindrone-ethinyl estradiol (FEMHRT LOW DOSE) 0.5-2.5 MG-MCG tablet Take 1 tablet by mouth daily. 90 tablet 3   No current facility-administered medications on file prior to visit.    Allergies  Allergen Reactions   Dilantin [Phenytoin Sodium Extended]       Review of Systems Review of Systems - General ROS: negative for - chills, fatigue, fever, weight gain or weight loss.   Psychological ROS: negative for - anxiety, decreased libido, depression, mood swings, physical abuse or sexual abuse Ophthalmic ROS: negative for - blurry vision, eye pain or loss of vision ENT ROS: negative for - headaches, hearing change, visual changes or vocal changes Allergy and Immunology ROS: negative for - hives, itchy/watery eyes or seasonal allergies Hematological and Lymphatic ROS: negative for - bleeding problems, bruising, swollen lymph nodes or weight loss Endocrine ROS: negative for - galactorrhea, hair pattern changes, hot flashes, malaise/lethargy, mood swings, palpitations, polydipsia/polyuria, skin changes, temperature intolerance or unexpected weight changes Breast ROS: negative for - new or changing breast lumps or nipple discharge Respiratory ROS: negative for - cough or shortness of breath Cardiovascular ROS: negative for - chest pain, irregular heartbeat, palpitations or shortness of breath Gastrointestinal ROS: no abdominal pain, change in bowel habits, or black or bloody stools Genito-Urinary ROS: no dysuria, trouble voiding, or hematuria Musculoskeletal ROS: negative for - joint pain or joint stiffness Neurological ROS: negative for -  bowel and bladder control changes Dermatological ROS: negative for rash and skin lesion changes   Objective:   BP (!) 143/81 (BP Location: Left Arm, Patient Position: Sitting, Cuff Size: Normal)   Pulse 87   Ht 5\' 7"  (1.702 m)   Wt 177 lb 9.6 oz (80.6 kg)   LMP 03/05/2013   BMI 27.82 kg/m  CONSTITUTIONAL: Well-developed, well-nourished female in no acute distress.  PSYCHIATRIC: Normal mood and affect. Normal behavior. Normal judgment and thought content. [place]: Alert and oriented to person, place, and time. Normal muscle tone coordination. No cranial nerve deficit noted. HENT:  Normocephalic, atraumatic, External right and left ear normal. Oropharynx is clear and moist EYES: Conjunctivae and EOM are normal. Pupils are equal, round, and reactive to light. No scleral icterus.  NECK: Normal range  of motion, supple, no masses.  Normal thyroid.  SKIN: Skin is warm and dry. No rash noted. Not diaphoretic. No erythema. No pallor. CARDIOVASCULAR: Normal heart rate noted, regular rhythm, no murmur. RESPIRATORY: Clear to auscultation bilaterally. Effort and breath sounds normal, no problems with respiration noted. BREASTS: Symmetric in size. No masses, skin changes, nipple drainage, or lymphadenopathy. ABDOMEN: Soft, normal bowel sounds, no distention noted.  No tenderness, rebound or guarding.  BLADDER: Normal PELVIC:  Bladder no bladder distension noted  Urethra: normal appearing urethra with no masses, tenderness or lesions  Vulva: normal appearing vulva with no masses, tenderness or lesions.    Vagina: atrophic, no discharge, no lesions  Cervix: normal appearing cervix without discharge or lesions. Stippling present.   Uterus: uterus is normal size, shape, consistency and nontender  Adnexa: normal adnexa in size, nontender and no masses  RV: External Exam NormaI, No Rectal Masses and Normal Sphincter tone  MUSCULOSKELETAL: Normal range of motion. No tenderness.  No cyanosis, clubbing,  or edema.  2+ distal pulses. LYMPHATIC: No Axillary, Supraclavicular, or Inguinal Adenopathy.   Labs: Lab Results  Component Value Date   WBC 6.2 09/07/2020   HGB 13.6 09/07/2020   HCT 41.4 09/07/2020   MCV 96 09/07/2020   PLT 305 09/07/2020    Lab Results  Component Value Date   CREATININE 0.81 09/07/2020   BUN 12 09/07/2020   NA 140 09/07/2020   K 4.7 09/07/2020   CL 103 09/07/2020   CO2 22 09/07/2020    Lab Results  Component Value Date   ALT 18 09/07/2020   AST 18 09/07/2020   ALKPHOS 57 09/07/2020   BILITOT 0.8 09/07/2020    Lab Results  Component Value Date   CHOL 241 (H) 09/07/2020   HDL 73 09/07/2020   LDLCALC 153 (H) 09/07/2020   TRIG 85 09/07/2020   CHOLHDL 3.3 09/07/2020    Lab Results  Component Value Date   TSH 1.380 09/07/2020    Lab Results  Component Value Date   HGBA1C 5.6 09/07/2020     Assessment:   Encounter for well woman exam with routine gynecological exam Vaginal atrophy  Menopausal symptoms on HRT Dyslipidemia  Hypertension Flu vaccine need  Plan:  - Pap: Not needed. Up to date. - Mammogram: Ordered - Colon screening:  Not Indicated. Patient is up to date on colonoscopy.  - Labs: None ordered. Up to date. Will f/u with PCP when due.  - Routine preventative health maintenance measures emphasized: Exercise/Diet/Weight control, Tobacco Warnings, Alcohol/Substance use risks, Stress Management and Safe Sex  - Encouraged to continue calcium and Vitamin D supplementation.  - HTN and Dyslipidemia to be managed by PCP.  - Discussed options for HRT, can attempt to wean, encouraged halving the dose for 2 weeks, then discontinuing. If symptoms return, can return to half dosing. Also can consider other OTC regimens for residual symptoms if needed.  - COVID vaccination status: patient has completed vaccination series.  - Flu vaccine given today.  - Return to Clinic - 1 Year  Rubie Maid, MD [facility]

## 2021-07-25 NOTE — Patient Instructions (Signed)
Breast Self-Awareness Breast self-awareness is knowing how your breasts look and feel. Doing breast self-awareness is important. It allows you to catch a breast problem early while it is still small and can be treated. All women should do breast self-awareness, including women who have had breast implants. Tell your doctor if you notice a change in your breasts. What you need: A mirror. A well-lit room. How to do a breast self-exam A breast self-exam is one way to learn what is normal for your breasts and to check for changes. To do a breast self-exam: Look for changes  Take off all the clothes above your waist. Stand in front of a mirror in a room with good lighting. Put your hands on your hips. Push your hands down. Look at your breasts and nipples in the mirror to see if one breast or nipple looks different from the other. Check to see if: The shape of one breast is different. The size of one breast is different. There are wrinkles, dips, and bumps in one breast and not the other. Look at each breast for changes in the skin, such as: Redness. Scaly areas. Look for changes in your nipples, such as: Liquid around the nipples. Bleeding. Dimpling. Redness. A change in where the nipples are. Feel for changes  Lie on your back on the floor. Feel each breast. To do this, follow these steps: Pick a breast to feel. Put the arm closest to that breast above your head. Use your other arm to feel the nipple area of your breast. Feel the area with the pads of your three middle fingers by making small circles with your fingers. For the first circle, press lightly. For the second circle, press harder. For the third circle, press even harder. Keep making circles with your fingers at the different pressures as you move down your breast. Stop when you feel your ribs. Move your fingers a little toward the center of your body. Start making circles with your fingers again, this time going up until  you reach your collarbone. Keep making up-and-down circles until you reach your armpit. Remember to keep using the three pressures. Feel the other breast in the same way. Sit or stand in the tub or shower. With soapy water on your skin, feel each breast the same way you did in step 2 when you were lying on the floor. Write down what you find Writing down what you find can help you remember what to tell your doctor. Write down: What is normal for each breast. Any changes you find in each breast, including: The kind of changes you find. Whether you have pain. Size and location of any lumps. When you last had your menstrual period. General tips Check your breasts every month. If you are breastfeeding, the best time to check your breasts is after you feed your baby or after you use a breast pump. If you get menstrual periods, the best time to check your breasts is 5-7 days after your menstrual period is over. With time, you will become comfortable with the self-exam, and you will begin to know if there are changes in your breasts. Contact a doctor if you: See a change in the shape or size of your breasts or nipples. See a change in the skin of your breast or nipples, such as red or scaly skin. Have fluid coming from your nipples that is not normal. Find a lump or thick area that was not there before. Have pain in   your breasts. Have any concerns about your breast health. Summary Breast self-awareness includes looking for changes in your breasts, as well as feeling for changes within your breasts. Breast self-awareness should be done in front of a mirror in a well-lit room. You should check your breasts every month. If you get menstrual periods, the best time to check your breasts is 5-7 days after your menstrual period is over. Let your doctor know of any changes you see in your breasts, including changes in size, changes on the skin, pain or tenderness, or fluid from your nipples that is not  normal. This information is not intended to replace advice given to you by your health care provider. Make sure you discuss any questions you have with your health care provider. Document Revised: 05/12/2018 Document Reviewed: 05/12/2018 Elsevier Patient Education  2022 [employer]    Preventive Care 40-64 Years Old, Female Preventive care refers to lifestyle choices and visits with your health care provider that can promote health and wellness. This includes: A yearly physical exam. This is also called an annual wellness visit. Regular dental and eye exams. Immunizations. Screening for certain conditions. Healthy lifestyle choices, such as: Eating a healthy diet. Getting regular exercise. Not using drugs or products that contain nicotine and tobacco. Limiting alcohol use. What can I expect for my preventive care visit? Physical exam Your health care provider will check your: Height and weight. These may be used to calculate your BMI (body mass index). BMI is a measurement that tells if you are at a healthy weight. Heart rate and blood pressure. Body temperature. Skin for abnormal spots. Counseling Your health care provider may ask you questions about your: Past medical problems. Family's medical history. Alcohol, tobacco, and drug use. Emotional well-being. Home life and relationship well-being. Sexual activity. Diet, exercise, and sleep habits. Work and work environment. Access to firearms. Method of birth control. Menstrual cycle. Pregnancy history. What immunizations do I need? Vaccines are usually given at various ages, according to a schedule. Your health care provider will recommend vaccines for you based on your age, medical history, and lifestyle or other factors, such as travel or where you work. What tests do I need? Blood tests Lipid and cholesterol levels. These may be checked every 5 years, or more often if you are over 50 years old. Hepatitis C  test. Hepatitis B test. Screening Lung cancer screening. You may have this screening every year starting at age 55 if you have a 30-pack-year history of smoking and currently smoke or have quit within the past 15 years. Colorectal cancer screening. All adults should have this screening starting at age 50 and continuing until age 75. Your health care provider may recommend screening at age 45 if you are at increased risk. You will have tests every 1-10 years, depending on your results and the type of screening test. Diabetes screening. This is done by checking your blood sugar (glucose) after you have not eaten for a while (fasting). You may have this done every 1-3 years. Mammogram. This may be done every 1-2 years. Talk with your health care provider about when you should start having regular mammograms. This may depend on whether you have a family history of breast cancer. BRCA-related cancer screening. This may be done if you have a family history of breast, ovarian, tubal, or peritoneal cancers. Pelvic exam and Pap test. This may be done every 3 years starting at age 21. Starting at age 30, this may be done every   5 years if you have a Pap test in combination with an HPV test. Other tests STD (sexually transmitted disease) testing, if you are at risk. Bone density scan. This is done to screen for osteoporosis. You may have this scan if you are at high risk for osteoporosis. Talk with your health care provider about your test results, treatment options, and if necessary, the need for more tests. Follow these instructions at home: Eating and drinking  Eat a diet that includes fresh fruits and vegetables, whole grains, lean protein, and low-fat dairy products. Take vitamin and mineral supplements as recommended by your health care provider. Do not drink alcohol if: Your health care provider tells you not to drink. You are pregnant, may be pregnant, or are planning to become pregnant. If  you drink alcohol: Limit how much you have to 0-1 drink a day. Be aware of how much alcohol is in your drink. In the [place] one drink equals one 12 oz bottle of beer (355 mL), one 5 oz glass of wine (148 mL), or one 1 oz glass of hard liquor (44 mL). Lifestyle Take daily care of your teeth and gums. Brush your teeth every morning and night with fluoride toothpaste. Floss one time each day. Stay active. Exercise for at least 30 minutes 5 or more days each week. Do not use any products that contain nicotine or tobacco, such as cigarettes, e-cigarettes, and chewing tobacco. If you need help quitting, ask your health care provider. Do not use drugs. If you are sexually active, practice safe sex. Use a condom or other form of protection to prevent STIs (sexually transmitted infections). If you do not wish to become pregnant, use a form of birth control. If you plan to become pregnant, see your health care provider for a prepregnancy visit. If told by your health care provider, take low-dose aspirin daily starting at age 50. Find healthy ways to cope with stress, such as: Meditation, yoga, or listening to music. Journaling. Talking to a trusted person. Spending time with friends and family. Safety Always wear your seat belt while driving or riding in a vehicle. Do not drive: If you have been drinking alcohol. Do not ride with someone who has been drinking. When you are tired or distracted. While texting. Wear a helmet and other protective equipment during sports activities. If you have firearms in your house, make sure you follow all gun safety procedures. What's next? Visit your health care provider once a year for an annual wellness visit. Ask your health care provider how often you should have your eyes and teeth checked. Stay up to date on all vaccines. This information is not intended to replace advice given to you by your health care provider. Make sure you discuss any questions you have  with your health care provider. Document Revised: 12/01/2020 Document Reviewed: 06/04/2018 Elsevier Patient Education  2022 Elsevier Inc.  

## 2021-08-20 DIAGNOSIS — I10 Essential (primary) hypertension: Secondary | ICD-10-CM | POA: Insufficient documentation

## 2021-09-07 ENCOUNTER — Encounter: Payer: Self-pay | Admitting: Emergency Medicine

## 2021-09-07 ENCOUNTER — Ambulatory Visit
Admission: EM | Admit: 2021-09-07 | Discharge: 2021-09-07 | Disposition: A | Discharge status: Home / Self Care | Payer: No Typology Code available for payment source | Attending: Emergency Medicine | Admitting: Emergency Medicine

## 2021-09-07 DIAGNOSIS — R051 Acute cough: Secondary | ICD-10-CM | POA: Diagnosis not present

## 2021-09-07 DIAGNOSIS — B349 Viral infection, unspecified: Secondary | ICD-10-CM

## 2021-09-07 MED ORDER — BENZONATATE 100 MG PO CAPS: 100.0000 mg | ORAL_CAPSULE | Freq: Three times a day (TID) | ORAL | 0 refills | Status: DC | PRN

## 2021-09-07 NOTE — ED Provider Notes (Signed)
Roderic Palau    CSN: [id number] Arrival date & time: 09/07/21  1333      History   Chief Complaint Chief Complaint  Patient presents with   Cough   Nasal Congestion   Sore Throat    HPI TERIANNE THAKER is a 60 y.o. female.  Patient presents with congestion, sore throat, cough x 4 days.  Negative COVID test at home.  She denies fever, rash, shortness of breath, vomiting, diarrhea or other symptoms.  Treatment at home with Mucinex.  Her medical history includes hypertension.  The history is provided by the patient and medical records.   Past Medical History:  Diagnosis Date   Allergic rhinitis, cause unspecified    Dysmenorrhea    Excessive or frequent menstruation    Hypertension    in the past, better after hormone therapy   Leiomyoma of uterus, unspecified    Migraine, unspecified, without mention of intractable migraine without mention of status migrainosus    migraines, better after starting hormone therapy   Palpitations    Personal history of other genital system and obstetric disorders(V13.29)    Seizure disorder ([place])    Unspecified epilepsy without mention of intractable epilepsy    last Sz over 25 years ago   Unspecified epilepsy without mention of intractable epilepsy    Unspecified symptom associated with female genital organs     Patient Active Problem List   Diagnosis Date Noted   Fibroid uterus 05/19/2017   Abnormal uterine bleeding 04/16/2017   Vaginal atrophy 02/27/2017   Vasomotor symptoms due to menopause 02/27/2017   Screening for breast cancer 02/06/2017   Wrist sprain 06/15/2014   Sprain of wrist 06/15/2014   Acute meniscal tear of left knee 03/16/2014   Knee torn cartilage 03/16/2014   Chest pain 03/24/2013   Hyperlipidemia 03/24/2013   Shortness of breath 03/24/2013   Family history of coronary artery disease 03/24/2013    Past Surgical History:  Procedure Laterality Date   COLONOSCOPY WITH PROPOFOL N/A 04/03/2018    Procedure: COLONOSCOPY WITH PROPOFOL;  Surgeon: Jonathon Bellows, MD;  Location: [facility] ENDOSCOPY;  Service: Gastroenterology;  Laterality: N/A;   HAND SURGERY     NO PAST SURGERIES     SKIN CANCER EXCISION      OB History     Gravida  3   Para  3   Term  3   Preterm      AB      Living  3      SAB      IAB      Ectopic      Multiple      Live Births  3            Home Medications    Prior to Admission medications   Medication Sig Start Date End Date Taking? Authorizing Provider  benzonatate (TESSALON) 100 MG capsule Take 1 capsule (100 mg total) by mouth 3 (three) times daily as needed for cough. 09/07/21  Yes Sharion Balloon, NP  Calcium Carbonate-Vitamin D (CALCIUM + D PO) Take by mouth daily.    [provider]  eletriptan (RELPAX) 40 MG tablet One tablet by mouth at onset of headache. May repeat in 2 hours if headache persists or recurs. may repeat in 2 hours if necessary    [provider]  Multiple Vitamin (MULTIVITAMIN) tablet Take 1 tablet by mouth daily.    [provider]  norethindrone-ethinyl estradiol (FEMHRT LOW DOSE) 0.5-2.5 MG-MCG  tablet Take 1 tablet by mouth daily. 10/03/20   Rubie Maid, MD    Family History Family History  Problem Relation Age of Onset   Heart disease Father    Heart attack Father    Diabetes Father    Parkinson's disease Mother    Breast cancer Neg Hx    Ovarian cancer Neg Hx    Colon cancer Neg Hx     Social History Social History   Tobacco Use   Smoking status: Never   Smokeless tobacco: Never  Vaping Use   Vaping Use: Never used  Substance Use Topics   Alcohol use: Yes    Alcohol/week: 3.0 standard drinks    Types: 3 Glasses of wine per week    Comment: weekly   Drug use: No     Allergies   Dilantin [phenytoin sodium extended]   Review of Systems Review of Systems  Constitutional:  Negative for chills and fever.  HENT:  Positive for congestion and sore throat. Negative for  ear pain.   Respiratory:  Positive for cough. Negative for shortness of breath.   Cardiovascular:  Negative for chest pain and palpitations.  Gastrointestinal:  Negative for diarrhea and vomiting.  Skin:  Negative for color change and rash.  All other systems reviewed and are negative.   Physical Exam Triage Vital Signs ED Triage Vitals  Enc Vitals Group     BP 09/07/21 1427 (!) 155/88     Pulse Rate 09/07/21 1427 83     Resp 09/07/21 1427 20     Temp 09/07/21 1427 98.6 F (37 C)     Temp src --      SpO2 09/07/21 1427 98 %     Weight --      Height --      Head Circumference --      Peak Flow --      Pain Score 09/07/21 1429 0     Pain Loc --      Pain Edu? --      Excl. in [place]? --    No data found.  Updated Vital Signs BP (!) 155/88   Pulse 83   Temp 98.6 F (37 C)   Resp 20   LMP 03/05/2013   SpO2 98%   Visual Acuity Right Eye Distance:   Left Eye Distance:   Bilateral Distance:    Right Eye Near:   Left Eye Near:    Bilateral Near:     Physical Exam Vitals and nursing note reviewed.  Constitutional:      General: She is not in acute distress.    Appearance: She is well-developed.  HENT:     Head: Normocephalic and atraumatic.     Right Ear: Tympanic membrane normal.     Left Ear: Tympanic membrane normal.     Nose: Congestion and rhinorrhea present.     Mouth/Throat:     Mouth: Mucous membranes are moist.     Pharynx: Oropharynx is clear.  Cardiovascular:     Rate and Rhythm: Normal rate and regular rhythm.     Heart sounds: Normal heart sounds.  Pulmonary:     Effort: Pulmonary effort is normal. No respiratory distress.     Breath sounds: Normal breath sounds.  Abdominal:     Palpations: Abdomen is soft.     Tenderness: There is no abdominal tenderness.  Musculoskeletal:     Cervical back: Neck supple.  Skin:    General: Skin is warm and  dry.  Neurological:     Mental Status: She is alert.  Psychiatric:        Mood and Affect: Mood  normal.        Behavior: Behavior normal.     UC Treatments / Results  Labs (all labs ordered are listed, but only abnormal results are displayed) Labs Reviewed  COVID-19, FLU A+B NAA    EKG   Radiology No results found.  Procedures Procedures (including critical care time)  Medications Ordered in UC Medications - No data to display  Initial Impression / Assessment and Plan / UC Course  I have reviewed the triage vital signs and the nursing notes.  Pertinent labs & imaging results that were available during my care of the patient were reviewed by me and considered in my medical decision making (see chart for details).   Cough, Viral illness.  COVID and Flu pending.  Instructed patient to self quarantine per CDC guidelines.  Treating cough with Tessalon Perles.  Discussed symptomatic treatment including Tylenol or ibuprofen, rest, hydration.  Instructed patient to follow up with PCP if symptoms are not improving.  Patient agrees to plan of care.    Final Clinical Impressions(s) / UC Diagnoses   Final diagnoses:  Acute cough  Viral illness     Discharge Instructions      Your COVID and Flu tests are pending.  You should self quarantine until the test results are back.    Take Tylenol or ibuprofen as needed for fever or discomfort.  Rest and keep yourself hydrated.    Follow-up with your primary care provider if your symptoms are not improving.         ED Prescriptions     Medication Sig Dispense Auth. Provider   benzonatate (TESSALON) 100 MG capsule Take 1 capsule (100 mg total) by mouth 3 (three) times daily as needed for cough. 21 capsule Sharion Balloon, NP      PDMP not reviewed this encounter.   Sharion Balloon, NP 09/07/21 1459

## 2021-09-07 NOTE — Discharge Instructions (Addendum)
Your COVID and Flu tests are pending.  You should self quarantine until the test results are back.    Take Tylenol or ibuprofen as needed for fever or discomfort.  Rest and keep yourself hydrated.    Follow-up with your primary care provider if your symptoms are not improving.     

## 2021-09-07 NOTE — ED Triage Notes (Signed)
Pt here with URI sx x 4 days without fever.

## 2021-09-08 LAB — COVID-19, FLU A+B NAA
Influenza A, NAA: NOT DETECTED
Influenza B, NAA: NOT DETECTED
SARS-CoV-2, NAA: NOT DETECTED

## 2021-09-15 ENCOUNTER — Other Ambulatory Visit: Payer: Self-pay

## 2021-09-15 ENCOUNTER — Ambulatory Visit
Admission: RE | Admit: 2021-09-15 | Discharge: 2021-09-15 | Disposition: A | Discharge status: Home / Self Care | Payer: No Typology Code available for payment source | Source: Ambulatory Visit | Attending: Family Medicine | Admitting: Family Medicine

## 2021-09-15 VITALS — BP 133/74 | HR 75 | Temp 98.7°F | Resp 18

## 2021-09-15 DIAGNOSIS — R059 Cough, unspecified: Secondary | ICD-10-CM

## 2021-09-15 DIAGNOSIS — J019 Acute sinusitis, unspecified: Secondary | ICD-10-CM | POA: Diagnosis not present

## 2021-09-15 MED ORDER — AZITHROMYCIN 200 MG/5ML PO SUSR: 200.0000 mg | Freq: Every day | ORAL | 0 refills | Status: DC

## 2021-09-15 MED ORDER — PROMETHAZINE-DM 6.25-15 MG/5ML PO SYRP: 5.0000 mL | ORAL_SOLUTION | Freq: Two times a day (BID) | ORAL | 0 refills | Status: DC | PRN

## 2021-09-15 NOTE — ED Provider Notes (Signed)
Roderic Palau    CSN: [id number] Arrival date & time: 09/15/21  1243      History   Chief Complaint Chief Complaint  Patient presents with   Cough   Nasal Congestion    HPI Charlotte Watkins is a 60 y.o. female.   HPI Patient presents over 10 days of URI symptoms.  Patient was seen here at urgent care on 09/07/2021 and diagnosed with a viral upper respiratory illness with cough and treated with benzonatate.  She has been taking Mucinex at home without relief of nasal congestion.  She has been afebrile.  Denies any worrisome symptoms of shortness of breath, wheezing or chest tightness.  Past Medical History:  Diagnosis Date   Allergic rhinitis, cause unspecified    Dysmenorrhea    Excessive or frequent menstruation    Hypertension    in the past, better after hormone therapy   Leiomyoma of uterus, unspecified    Migraine, unspecified, without mention of intractable migraine without mention of status migrainosus    migraines, better after starting hormone therapy   Palpitations    Personal history of other genital system and obstetric disorders(V13.29)    Seizure disorder ([place])    Unspecified epilepsy without mention of intractable epilepsy    last Sz over 25 years ago   Unspecified epilepsy without mention of intractable epilepsy    Unspecified symptom associated with female genital organs     Patient Active Problem List   Diagnosis Date Noted   Fibroid uterus 05/19/2017   Abnormal uterine bleeding 04/16/2017   Vaginal atrophy 02/27/2017   Vasomotor symptoms due to menopause 02/27/2017   Screening for breast cancer 02/06/2017   Wrist sprain 06/15/2014   Sprain of wrist 06/15/2014   Acute meniscal tear of left knee 03/16/2014   Knee torn cartilage 03/16/2014   Chest pain 03/24/2013   Hyperlipidemia 03/24/2013   Shortness of breath 03/24/2013   Family history of coronary artery disease 03/24/2013    Past Surgical History:  Procedure Laterality Date    COLONOSCOPY WITH PROPOFOL N/A 04/03/2018   Procedure: COLONOSCOPY WITH PROPOFOL;  Surgeon: Jonathon Bellows, MD;  Location: [facility] ENDOSCOPY;  Service: Gastroenterology;  Laterality: N/A;   HAND SURGERY     NO PAST SURGERIES     SKIN CANCER EXCISION      OB History     Gravida  3   Para  3   Term  3   Preterm      AB      Living  3      SAB      IAB      Ectopic      Multiple      Live Births  3            Home Medications    Prior to Admission medications   Medication Sig Start Date End Date Taking? Authorizing Provider  azithromycin (ZITHROMAX) 200 MG/5ML suspension Take 5 mLs (200 mg total) by mouth daily. 09/15/21  Yes Scot Jun, FNP  promethazine-dextromethorphan (PROMETHAZINE-DM) 6.25-15 MG/5ML syrup Take 5 mLs by mouth 2 (two) times daily as needed for cough. 09/15/21  Yes Scot Jun, FNP  benzonatate (TESSALON) 100 MG capsule Take 1 capsule (100 mg total) by mouth 3 (three) times daily as needed for cough. 09/07/21   Sharion Balloon, NP  Calcium Carbonate-Vitamin D (CALCIUM + D PO) Take by mouth daily.    [provider]  eletriptan (RELPAX) 40 MG tablet  One tablet by mouth at onset of headache. May repeat in 2 hours if headache persists or recurs. may repeat in 2 hours if necessary    [provider]  Multiple Vitamin (MULTIVITAMIN) tablet Take 1 tablet by mouth daily.    [provider]  norethindrone-ethinyl estradiol (FEMHRT LOW DOSE) 0.5-2.5 MG-MCG tablet Take 1 tablet by mouth daily. 10/03/20   Rubie Maid, MD    Family History Family History  Problem Relation Age of Onset   Heart disease Father    Heart attack Father    Diabetes Father    Parkinson's disease Mother    Breast cancer Neg Hx    Ovarian cancer Neg Hx    Colon cancer Neg Hx     Social History Social History   Tobacco Use   Smoking status: Never   Smokeless tobacco: Never  Vaping Use   Vaping Use: Never used  Substance Use Topics    Alcohol use: Yes    Alcohol/week: 3.0 standard drinks    Types: 3 Glasses of wine per week    Comment: weekly   Drug use: No     Allergies   Dilantin [phenytoin sodium extended]   Review of Systems Review of Systems Pertinent negatives listed in HPI   Physical Exam Triage Vital Signs ED Triage Vitals  Enc Vitals Group     BP 09/15/21 1258 133/74     Pulse Rate 09/15/21 1258 75     Resp 09/15/21 1258 18     Temp 09/15/21 1258 98.7 F (37.1 C)     Temp Source 09/15/21 1258 Oral     SpO2 09/15/21 1258 96 %     Weight --      Height --      Head Circumference --      Peak Flow --      Pain Score 09/15/21 1259 3     Pain Loc --      Pain Edu? --      Excl. in [place]? --    No data found.  Updated Vital Signs BP 133/74 (BP Location: Left Arm)   Pulse 75   Temp 98.7 F (37.1 C) (Oral)   Resp 18   LMP 03/05/2013   SpO2 96%   Visual Acuity Right Eye Distance:   Left Eye Distance:   Bilateral Distance:    Right Eye Near:   Left Eye Near:    Bilateral Near:     Physical Exam  General Appearance:    Alert, cooperative, no distress  HENT:   Normocephalic, ears normal, nares mucosal edema with congestion, rhinorrhea, oropharynx patent    Eyes:    PERRL, conjunctiva/corneas clear, EOM's intact       Lungs:     Clear to auscultation bilaterally, respirations unlabored  Heart:    Regular rate and rhythm  Neurologic:   Awake, alert, oriented x 3. No apparent focal neurological           defect.     UC Treatments / Results  Labs (all labs ordered are listed, but only abnormal results are displayed) Labs Reviewed - No data to display  EKG   Radiology No results found.  Procedures Procedures (including critical care time)  Medications Ordered in UC Medications - No data to display  Initial Impression / Assessment and Plan / UC Course  I have reviewed the triage vital signs and the nursing notes.  Pertinent labs & imaging results that were available during  my care of the patient were reviewed by me and considered in my medical decision making (see chart for details).    Acute nonrecurrent sinusitis, with cough Treating with azithromycin and Promethazine DM for cough Hydrate well with fluids.  Recommend Flonase for management of nasal inflammation. Return as needed. Final Clinical Impressions(s) / UC Diagnoses   Final diagnoses:  Acute non-recurrent sinusitis, unspecified location  Cough, unspecified type   Discharge Instructions   None    ED Prescriptions     Medication Sig Dispense Auth. Provider   azithromycin (ZITHROMAX) 200 MG/5ML suspension Take 5 mLs (200 mg total) by mouth daily. 22.5 mL Scot Jun, FNP   promethazine-dextromethorphan (PROMETHAZINE-DM) 6.25-15 MG/5ML syrup Take 5 mLs by mouth 2 (two) times daily as needed for cough. 118 mL Scot Jun, FNP      PDMP not reviewed this encounter.   Scot Jun, FNP 09/15/21 1328

## 2021-09-15 NOTE — ED Triage Notes (Signed)
Pt was seen on 09/07/21 and is not better. She has a cough and runny nose.

## 2022-01-08 ENCOUNTER — Ambulatory Visit
Admission: RE | Admit: 2022-01-08 | Discharge: 2022-01-08 | Disposition: A | Discharge status: Home / Self Care | Payer: 59 | Source: Ambulatory Visit | Attending: Family Medicine | Admitting: Family Medicine

## 2022-01-08 VITALS — BP 118/75 | HR 82 | Temp 98.2°F | Resp 18

## 2022-01-08 DIAGNOSIS — H9202 Otalgia, left ear: Secondary | ICD-10-CM

## 2022-01-08 DIAGNOSIS — J302 Other seasonal allergic rhinitis: Secondary | ICD-10-CM | POA: Diagnosis not present

## 2022-01-08 NOTE — Discharge Instructions (Addendum)
Take ibuprofen, Flonase nasal spray, plain Mucinex as directed.  Follow up with your primary care provider if your symptoms are not improving.   ? ?

## 2022-01-08 NOTE — ED Triage Notes (Signed)
Left ear fullness and pain x 4 days.  ?

## 2022-01-08 NOTE — ED Provider Notes (Signed)
?[facility] ? ? ? ?CSN: [id number] ?Arrival date & time: 01/08/22  0906 ? ? ?  ? ?History   ?Chief Complaint ?Chief Complaint  ?Patient presents with  ? Ear Fullness  ?  Left ear hurts. Also causing teeth sensitivity. - Entered by patient  ? Otalgia  ? ? ?HPI ?Charlotte Watkins is a 61 y.o. female.  Patient presents with left ear pain and full feeling x4 days.  She also has postnasal drip, runny nose, congestion.  She denies ear drainage, change in hearing, fever, chills, sore throat, shortness of breath, or other symptoms.  Her medical history includes hypertension, allergic rhinitis, epilepsy, dysmenorrhea, migraine headaches. ? ?The history is provided by the patient and medical records.  ? ?Past Medical History:  ?Diagnosis Date  ? Allergic rhinitis, cause unspecified   ? Dysmenorrhea   ? Excessive or frequent menstruation   ? Hypertension   ? in the past, better after hormone therapy  ? Leiomyoma of uterus, unspecified   ? Migraine, unspecified, without mention of intractable migraine without mention of status migrainosus   ? migraines, better after starting hormone therapy  ? Palpitations   ? Personal history of other genital system and obstetric disorders(V13.29)   ? Seizure disorder ([place])   ? Unspecified epilepsy without mention of intractable epilepsy   ? last Sz over 25 years ago  ? Unspecified epilepsy without mention of intractable epilepsy   ? Unspecified symptom associated with female genital organs   ? ? ?Patient Active Problem List  ? Diagnosis Date Noted  ? Fibroid uterus 05/19/2017  ? Abnormal uterine bleeding 04/16/2017  ? Vaginal atrophy 02/27/2017  ? Vasomotor symptoms due to menopause 02/27/2017  ? Screening for breast cancer 02/06/2017  ? Wrist sprain 06/15/2014  ? Sprain of wrist 06/15/2014  ? Acute meniscal tear of left knee 03/16/2014  ? Knee torn cartilage 03/16/2014  ? Chest pain 03/24/2013  ? Hyperlipidemia 03/24/2013  ? Shortness of breath 03/24/2013  ? Family history of  coronary artery disease 03/24/2013  ? ? ?Past Surgical History:  ?Procedure Laterality Date  ? COLONOSCOPY WITH PROPOFOL N/A 04/03/2018  ? Procedure: COLONOSCOPY WITH PROPOFOL;  Surgeon: Jonathon Bellows, MD;  Location: [facility] ENDOSCOPY;  Service: Gastroenterology;  Laterality: N/A;  ? HAND SURGERY    ? NO PAST SURGERIES    ? SKIN CANCER EXCISION    ? ? ?OB History   ? ? Gravida  ?3  ? Para  ?3  ? Term  ?3  ? Preterm  ?   ? AB  ?   ? Living  ?3  ?  ? ? SAB  ?   ? IAB  ?   ? Ectopic  ?   ? Multiple  ?   ? Live Births  ?3  ?   ?  ?  ? ? ? ?Home Medications   ? ?Prior to Admission medications   ?Medication Sig Start Date End Date Taking? Authorizing Provider  ?azithromycin (ZITHROMAX) 200 MG/5ML suspension Take 5 mLs (200 mg total) by mouth daily. 09/15/21   Scot Jun, FNP  ?benzonatate (TESSALON) 100 MG capsule Take 1 capsule (100 mg total) by mouth 3 (three) times daily as needed for cough. 09/07/21   Sharion Balloon, NP  ?Calcium Carbonate-Vitamin D (CALCIUM + D PO) Take by mouth daily.    [provider]  ?eletriptan (RELPAX) 40 MG tablet One tablet by mouth at onset of headache. May repeat in 2 hours if headache  persists or recurs. may repeat in 2 hours if necessary    [provider]  ?Multiple Vitamin (MULTIVITAMIN) tablet Take 1 tablet by mouth daily.    [provider]  ?norethindrone-ethinyl estradiol (FEMHRT LOW DOSE) 0.5-2.5 MG-MCG tablet Take 1 tablet by mouth daily. 10/03/20   Rubie Maid, MD  ?promethazine-dextromethorphan (PROMETHAZINE-DM) 6.25-15 MG/5ML syrup Take 5 mLs by mouth 2 (two) times daily as needed for cough. 09/15/21   Scot Jun, FNP  ? ? ?Family History ?Family History  ?Problem Relation Age of Onset  ? Heart disease Father   ? Heart attack Father   ? Diabetes Father   ? Parkinson's disease Mother   ? Breast cancer Neg Hx   ? Ovarian cancer Neg Hx   ? Colon cancer Neg Hx   ? ? ?Social History ?Social History  ? ?Tobacco Use  ? Smoking status: Never  ?  Smokeless tobacco: Never  ?Vaping Use  ? Vaping Use: Never used  ?Substance Use Topics  ? Alcohol use: Yes  ?  Alcohol/week: 3.0 standard drinks  ?  Types: 3 Glasses of wine per week  ?  Comment: weekly  ? Drug use: No  ? ? ? ?Allergies   ?Dilantin [phenytoin sodium extended] ? ? ?Review of Systems ?Review of Systems  ?Constitutional:  Negative for chills and fever.  ?HENT:  Positive for congestion, ear pain, postnasal drip and rhinorrhea. Negative for sore throat.   ?Respiratory:  Negative for cough and shortness of breath.   ?Cardiovascular:  Negative for chest pain and palpitations.  ?Gastrointestinal:  Negative for diarrhea and vomiting.  ?Skin:  Negative for color change and rash.  ?All other systems reviewed and are negative. ? ? ?Physical Exam ?Triage Vital Signs ?ED Triage Vitals  ?Enc Vitals Group  ?   BP   ?   Pulse   ?   Resp   ?   Temp   ?   Temp src   ?   SpO2   ?   Weight   ?   Height   ?   Head Circumference   ?   Peak Flow   ?   Pain Score   ?   Pain Loc   ?   Pain Edu?   ?   Excl. in [place]?   ? ?No data found. ? ?Updated Vital Signs ?BP 118/75   Pulse 82   Temp 98.2 ?F (36.8 ?C)   Resp 18   LMP 03/05/2013   SpO2 97%  ? ?Visual Acuity ?Right Eye Distance:   ?Left Eye Distance:   ?Bilateral Distance:   ? ?Right Eye Near:   ?Left Eye Near:    ?Bilateral Near:    ? ?Physical Exam ?Vitals and nursing note reviewed.  ?Constitutional:   ?   General: She is not in acute distress. ?   Appearance: Normal appearance. She is well-developed. She is not ill-appearing.  ?HENT:  ?   Right Ear: Tympanic membrane and ear canal normal.  ?   Left Ear: Tympanic membrane and ear canal normal.  ?   Nose: Nose normal.  ?   Mouth/Throat:  ?   Mouth: Mucous membranes are moist.  ?   Pharynx: Oropharynx is clear.  ?Cardiovascular:  ?   Rate and Rhythm: Normal rate and regular rhythm.  ?   Heart sounds: Normal heart sounds.  ?Pulmonary:  ?   Effort: Pulmonary effort is normal. No respiratory distress.  ?   Breath  sounds:  Normal breath sounds.  ?Musculoskeletal:  ?   Cervical back: Neck supple.  ?Skin: ?   General: Skin is warm and dry.  ?Neurological:  ?   Mental Status: She is alert.  ?Psychiatric:     ?   Mood and Affect: Mood normal.     ?   Behavior: Behavior normal.  ? ? ? ?UC Treatments / Results  ?Labs ?(all labs ordered are listed, but only abnormal results are displayed) ?Labs Reviewed - No data to display ? ?EKG ? ? ?Radiology ?No results found. ? ?Procedures ?Procedures (including critical care time) ? ?Medications Ordered in UC ?Medications - No data to display ? ?Initial Impression / Assessment and Plan / UC Course  ?I have reviewed the triage vital signs and the nursing notes. ? ?Pertinent labs & imaging results that were available during my care of the patient were reviewed by me and considered in my medical decision making (see chart for details). ? ?Seasonal allergies, otalgia.  Discussed symptomatic treatment including ibuprofen, Flonase, plain Mucinex.  Education provided on seasonal allergies and earaches.  Instructed patient to follow up with her PCP if her symptoms are not improving.  She agrees to plan of care.  ? ? ? ?Final Clinical Impressions(s) / UC Diagnoses  ? ?Final diagnoses:  ?Seasonal allergies  ?Left ear pain  ? ? ? ?Discharge Instructions   ? ?  ?Take ibuprofen, Flonase nasal spray, plain Mucinex as directed.  Follow up with your primary care provider if your symptoms are not improving.   ? ? ? ? ? ?ED Prescriptions   ?None ?  ? ?PDMP not reviewed this encounter. ?  ?Sharion Balloon, NP ?01/08/22 [phone] ? ?

## 2022-04-01 IMAGING — MG DIGITAL SCREENING BILAT W/ TOMO W/ CAD
6 of 12 series · 6 of 36 positions shown · non-contrast
Comparison: Previous exam(s).

CLINICAL DATA: Screening.

EXAM:
DIGITAL SCREENING BILATERAL MAMMOGRAM WITH TOMO AND CAD

[R CC synth-2D (1 of 2)]
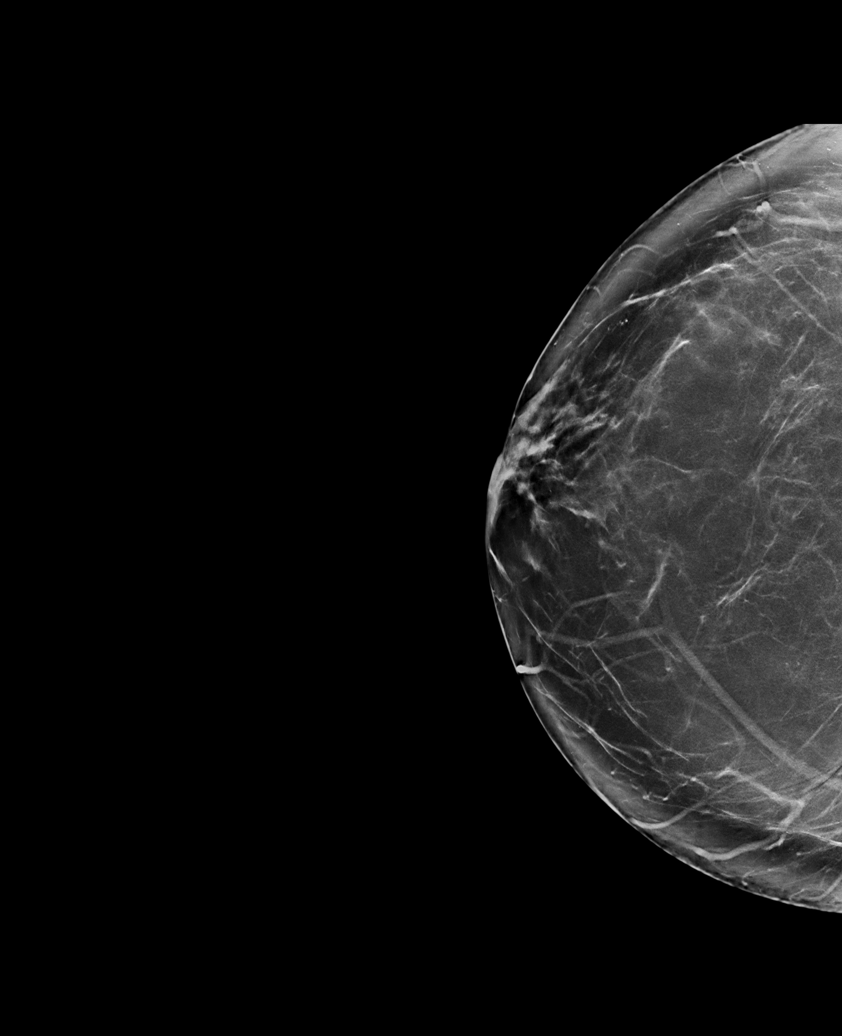

[R MLO synth-2D (1 of 2)]
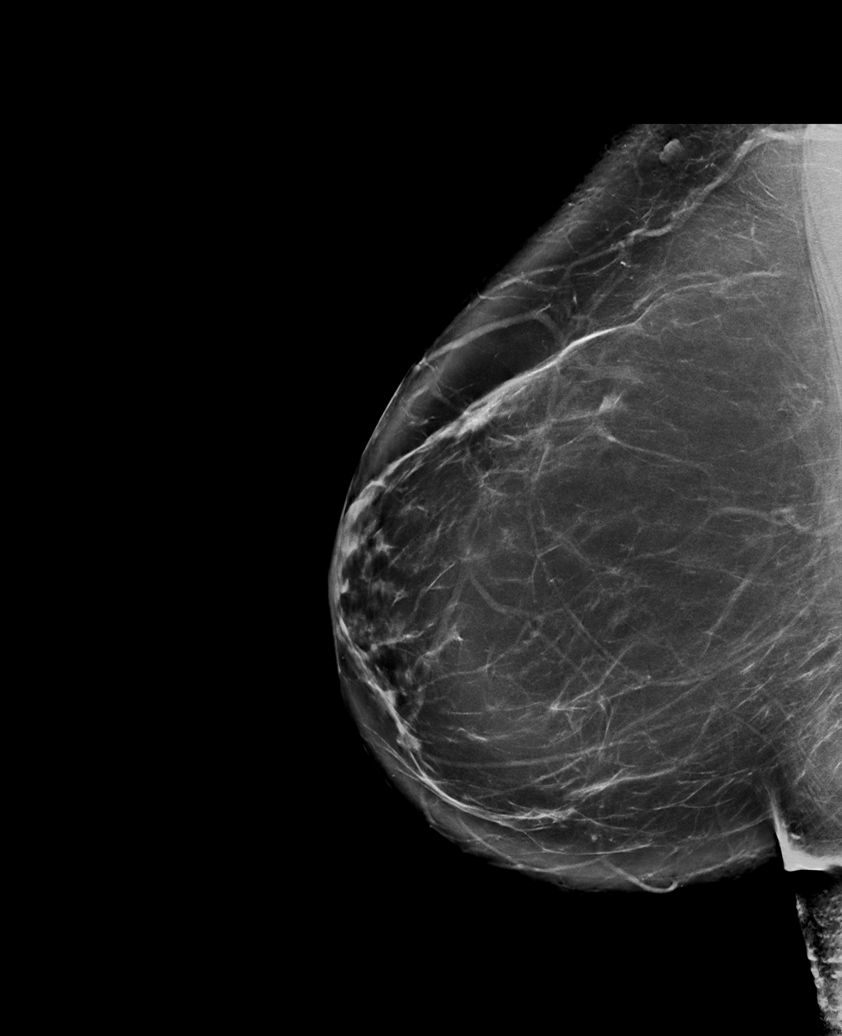

[R CC synth-2D (2 of 2)]
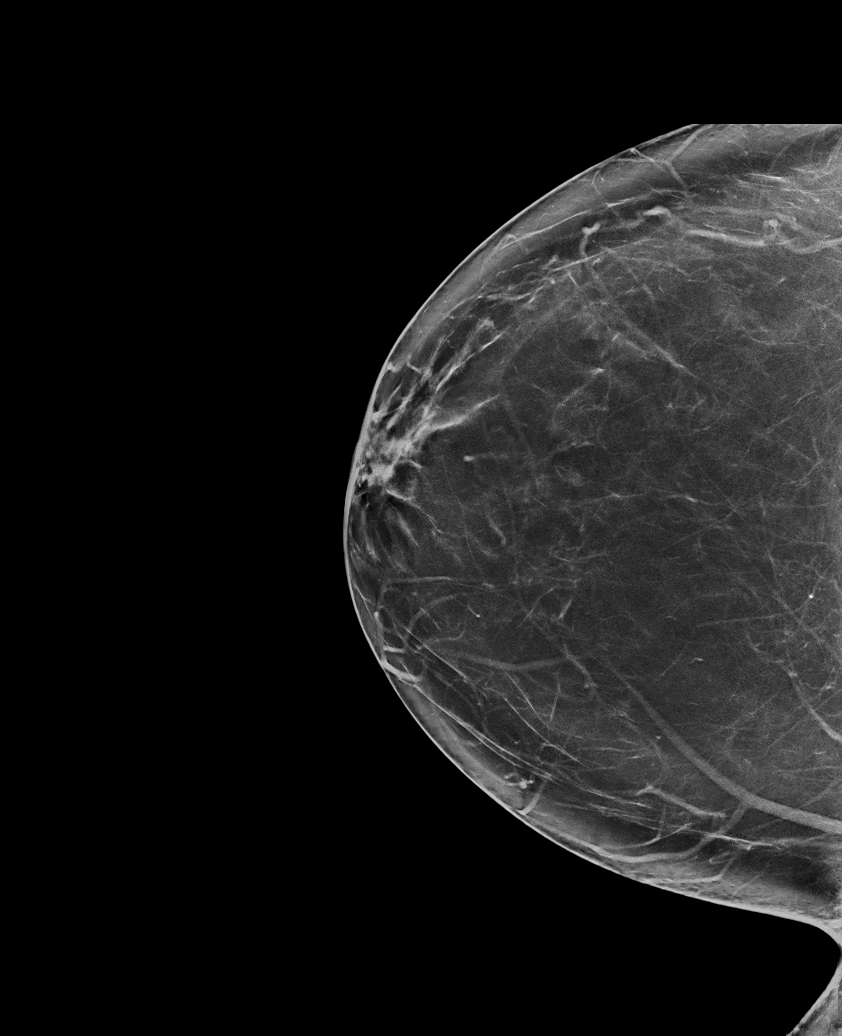

[L CC synth-2D]
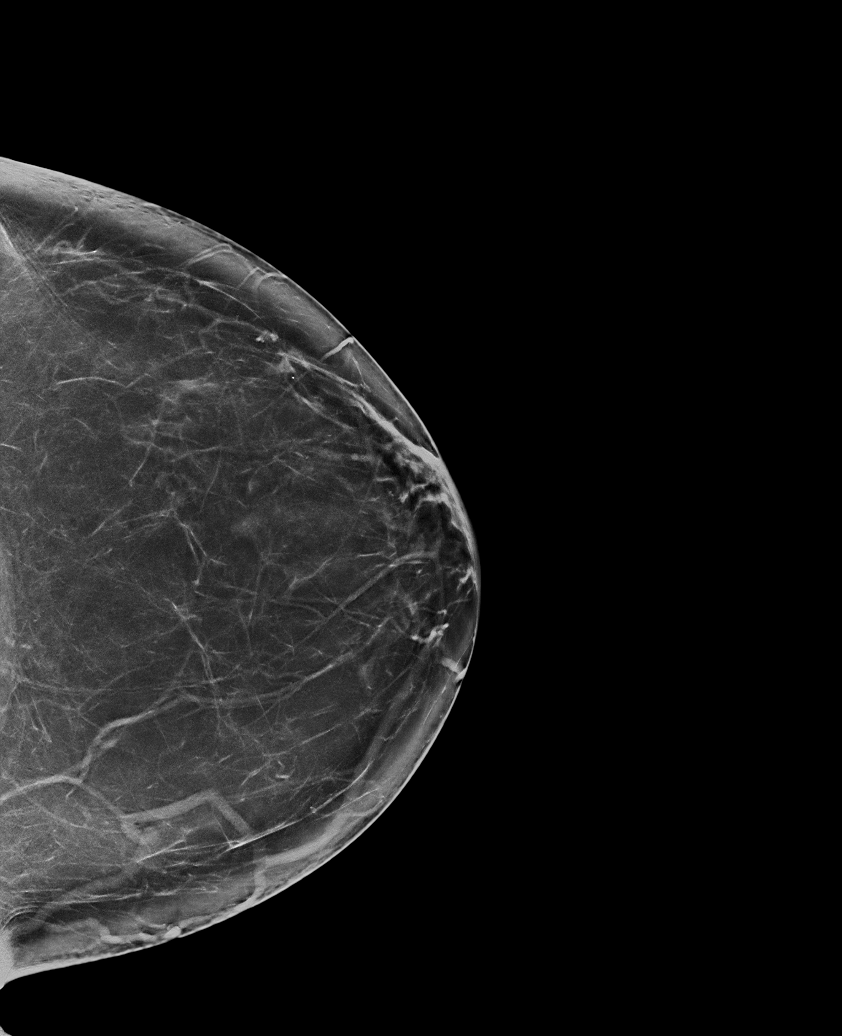

[R MLO synth-2D (2 of 2)]
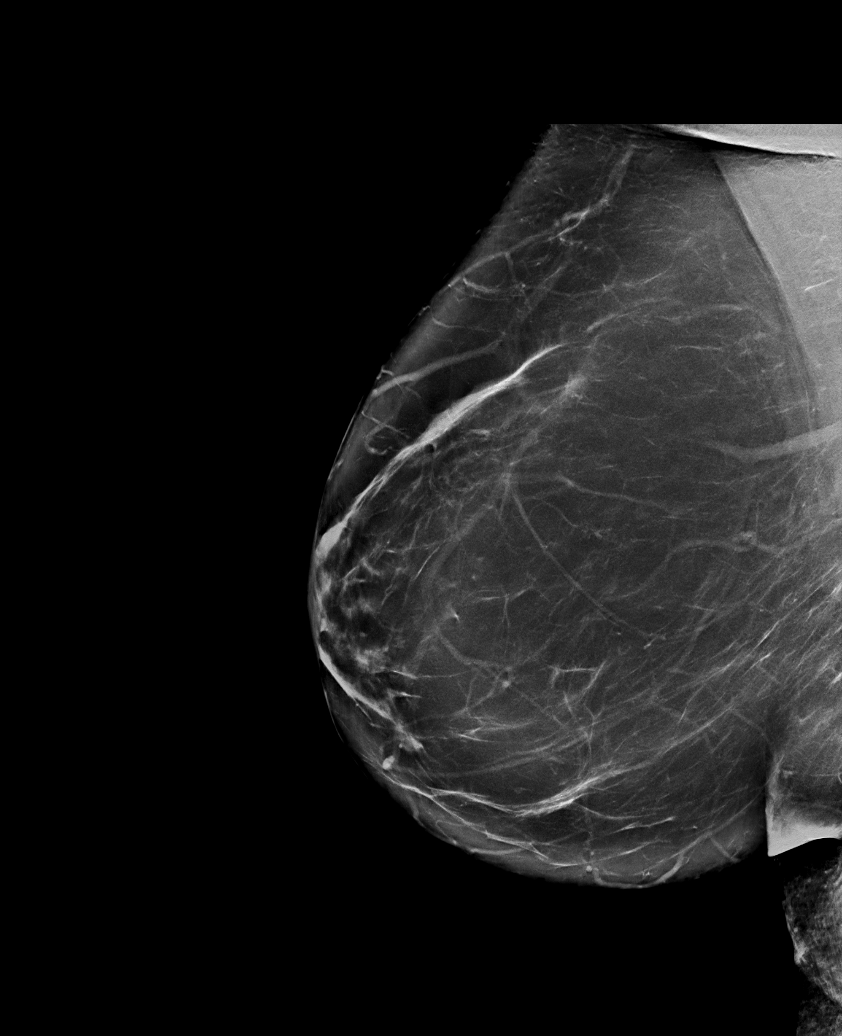

[L MLO synth-2D]
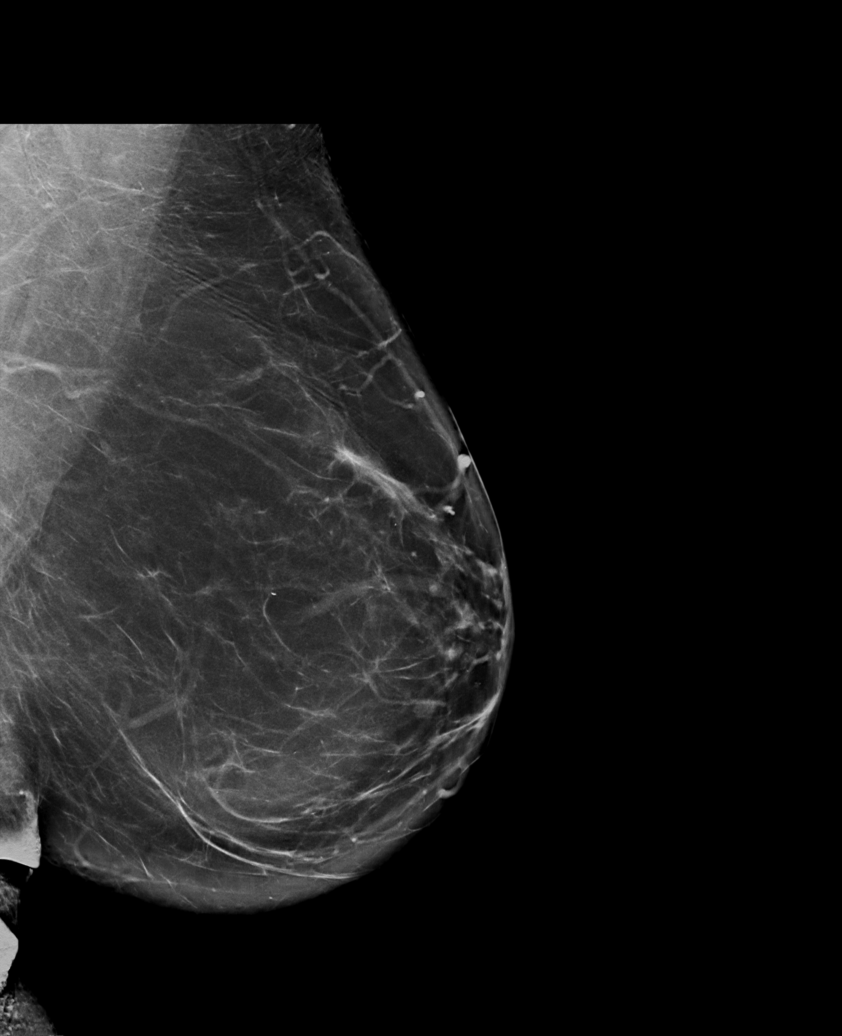

[6 of 36 positions shown; findings below may reference images not displayed]

ACR Breast Density Category b: There are scattered areas of
fibroglandular density.
FINDINGS: There are no findings suspicious for malignancy. Images were
processed with CAD.
IMPRESSION: No mammographic evidence of malignancy. A result letter of this
screening mammogram will be mailed directly to the patient.

RECOMMENDATION:
Screening mammogram in one year. (Code:[id number])

BI-RADS CATEGORY  1: Negative.

## 2022-06-20 DIAGNOSIS — E871 Hypo-osmolality and hyponatremia: Secondary | ICD-10-CM | POA: Diagnosis not present

## 2022-06-20 DIAGNOSIS — R319 Hematuria, unspecified: Secondary | ICD-10-CM | POA: Diagnosis not present

## 2022-07-03 ENCOUNTER — Other Ambulatory Visit: Payer: Self-pay | Admitting: Obstetrics and Gynecology

## 2022-07-03 DIAGNOSIS — Z1231 Encounter for screening mammogram for malignant neoplasm of breast: Secondary | ICD-10-CM

## 2022-07-31 ENCOUNTER — Ambulatory Visit
Admission: RE | Admit: 2022-07-31 | Discharge: 2022-07-31 | Disposition: A | Discharge status: Home / Self Care | Payer: 59 | Source: Ambulatory Visit | Attending: Obstetrics and Gynecology | Admitting: Obstetrics and Gynecology

## 2022-07-31 DIAGNOSIS — Z1231 Encounter for screening mammogram for malignant neoplasm of breast: Secondary | ICD-10-CM | POA: Diagnosis not present

## 2022-08-14 ENCOUNTER — Ambulatory Visit: Payer: Self-pay | Admitting: Obstetrics and Gynecology

## 2022-08-15 DIAGNOSIS — B078 Other viral warts: Secondary | ICD-10-CM | POA: Diagnosis not present

## 2022-08-15 DIAGNOSIS — D2261 Melanocytic nevi of right upper limb, including shoulder: Secondary | ICD-10-CM | POA: Diagnosis not present

## 2022-08-15 DIAGNOSIS — X32XXXA Exposure to sunlight, initial encounter: Secondary | ICD-10-CM | POA: Diagnosis not present

## 2022-08-15 DIAGNOSIS — D2262 Melanocytic nevi of left upper limb, including shoulder: Secondary | ICD-10-CM | POA: Diagnosis not present

## 2022-08-15 DIAGNOSIS — D485 Neoplasm of uncertain behavior of skin: Secondary | ICD-10-CM | POA: Diagnosis not present

## 2022-08-15 DIAGNOSIS — L570 Actinic keratosis: Secondary | ICD-10-CM | POA: Diagnosis not present

## 2022-08-15 DIAGNOSIS — Z85828 Personal history of other malignant neoplasm of skin: Secondary | ICD-10-CM | POA: Diagnosis not present

## 2022-08-15 DIAGNOSIS — D2271 Melanocytic nevi of right lower limb, including hip: Secondary | ICD-10-CM | POA: Diagnosis not present

## 2022-09-11 ENCOUNTER — Ambulatory Visit: Payer: Self-pay | Admitting: Obstetrics and Gynecology

## 2022-09-13 NOTE — Patient Instructions (Incomplete)
Preventive Care 40-62 Years Old, Female Preventive care refers to lifestyle choices and visits with your health care provider that can promote health and wellness. Preventive care visits are also called wellness exams. What can I expect for my preventive care visit? Counseling Your health care provider may ask you questions about your: Medical history, including: Past medical problems. Family medical history. Pregnancy history. Current health, including: Menstrual cycle. Method of birth control. Emotional well-being. Home life and relationship well-being. Sexual activity and sexual health. Lifestyle, including: Alcohol, nicotine or tobacco, and drug use. Access to firearms. Diet, exercise, and sleep habits. Work and work environment. Sunscreen use. Safety issues such as seatbelt and bike helmet use. Physical exam Your health care provider will check your: Height and weight. These may be used to calculate your BMI (body mass index). BMI is a measurement that tells if you are at a healthy weight. Waist circumference. This measures the distance around your waistline. This measurement also tells if you are at a healthy weight and may help predict your risk of certain diseases, such as type 2 diabetes and high blood pressure. Heart rate and blood pressure. Body temperature. Skin for abnormal spots. What immunizations do I need?  Vaccines are usually given at various ages, according to a schedule. Your health care provider will recommend vaccines for you based on your age, medical history, and lifestyle or other factors, such as travel or where you work. What tests do I need? Screening Your health care provider may recommend screening tests for certain conditions. This may include: Lipid and cholesterol levels. Diabetes screening. This is done by checking your blood sugar (glucose) after you have not eaten for a while (fasting). Pelvic exam and Pap test. Hepatitis B test. Hepatitis C  test. HIV (human immunodeficiency virus) test. STI (sexually transmitted infection) testing, if you are at risk. Lung cancer screening. Colorectal cancer screening. Mammogram. Talk with your health care provider about when you should start having regular mammograms. This may depend on whether you have a family history of breast cancer. BRCA-related cancer screening. This may be done if you have a family history of breast, ovarian, tubal, or peritoneal cancers. Bone density scan. This is done to screen for osteoporosis. Talk with your health care provider about your test results, treatment options, and if necessary, the need for more tests. Follow these instructions at home: Eating and drinking  Eat a diet that includes fresh fruits and vegetables, whole grains, lean protein, and low-fat dairy products. Take vitamin and mineral supplements as recommended by your health care provider. Do not drink alcohol if: Your health care provider tells you not to drink. You are pregnant, may be pregnant, or are planning to become pregnant. If you drink alcohol: Limit how much you have to 0-1 drink a day. Know how much alcohol is in your drink. In the [place] one drink equals one 12 oz bottle of beer (355 mL), one 5 oz glass of wine (148 mL), or one 1 oz glass of hard liquor (44 mL). Lifestyle Brush your teeth every morning and night with fluoride toothpaste. Floss one time each day. Exercise for at least 30 minutes 5 or more days each week. Do not use any products that contain nicotine or tobacco. These products include cigarettes, chewing tobacco, and vaping devices, such as e-cigarettes. If you need help quitting, ask your health care provider. Do not use drugs. If you are sexually active, practice safe sex. Use a condom or other form of protection to   prevent STIs. If you do not wish to become pregnant, use a form of birth control. If you plan to become pregnant, see your health care provider for a  prepregnancy visit. Take aspirin only as told by your health care provider. Make sure that you understand how much to take and what form to take. Work with your health care provider to find out whether it is safe and beneficial for you to take aspirin daily. Find healthy ways to manage stress, such as: Meditation, yoga, or listening to music. Journaling. Talking to a trusted person. Spending time with friends and family. Minimize exposure to UV radiation to reduce your risk of skin cancer. Safety Always wear your seat belt while driving or riding in a vehicle. Do not drive: If you have been drinking alcohol. Do not ride with someone who has been drinking. When you are tired or distracted. While texting. If you have been using any mind-altering substances or drugs. Wear a helmet and other protective equipment during sports activities. If you have firearms in your house, make sure you follow all gun safety procedures. Seek help if you have been physically or sexually abused. What's next? Visit your health care provider once a year for an annual wellness visit. Ask your health care provider how often you should have your eyes and teeth checked. Stay up to date on all vaccines. This information is not intended to replace advice given to you by your health care provider. Make sure you discuss any questions you have with your health care provider. Document Revised: 03/21/2021 Document Reviewed: 03/21/2021 Elsevier Patient Education  [place].

## 2022-09-13 NOTE — Progress Notes (Unsigned)
ANNUAL PREVENTATIVE CARE GYNECOLOGY  ENCOUNTER NOTE  Subjective:       Charlotte Watkins is a 61 y.o. G48P3003 female here for a routine annual gynecologic exam. The patient {is/is not/has never been:13135} sexually active. The patient is taking hormone replacement therapy. {post-men bleed:13152::"Patient denies post-menopausal vaginal bleeding."} The patient wears seatbelts: {yes/no:311178}. The patient participates in regular exercise: {yes/no/not asked:9010}. Has the patient ever been transfused or tattooed?: {yes/no/not asked:9010}. The patient reports that there {is/is not:9024} domestic violence in her life.  Current complaints: 1.  ***    Gynecologic History Patient's last menstrual period was 03/05/2013. Contraception: post menopausal status Last Pap: 07/12/2020. Results were: normal Last mammogram: 07/31/2022. Results were: normal Last Colonoscopy: 04/03/2018: 10 YEARS Last Dexa Scan: Never done   Obstetric History OB History  Gravida Para Term Preterm AB Living  '3 3 3     3  '$ SAB IAB Ectopic Multiple Live Births          3    # Outcome Date GA Lbr Len/2nd Weight Sex Delivery Anes PTL Lv  3 Term 1990   6 lb 11.2 oz (3.039 kg) M Vag-Spont   LIV  2 Term 1989   8 lb 1.6 oz (3.674 kg) F Vag-Spont   LIV  1 Term 1986   6 lb 14.4 oz (3.13 kg) F Vag-Spont   LIV    Past Medical History:  Diagnosis Date   Allergic rhinitis, cause unspecified    Dysmenorrhea    Excessive or frequent menstruation    Hypertension    in the past, better after hormone therapy   Leiomyoma of uterus, unspecified    Migraine, unspecified, without mention of intractable migraine without mention of status migrainosus    migraines, better after starting hormone therapy   Palpitations    Personal history of other genital system and obstetric disorders(V13.29)    Seizure disorder ([place])    Unspecified epilepsy without mention of intractable epilepsy    last Sz over 25 years ago   Unspecified epilepsy  without mention of intractable epilepsy    Unspecified symptom associated with female genital organs     Family History  Problem Relation Age of Onset   Heart disease Father    Heart attack Father    Diabetes Father    Parkinson's disease Mother    Breast cancer Neg Hx    Ovarian cancer Neg Hx    Colon cancer Neg Hx     Past Surgical History:  Procedure Laterality Date   COLONOSCOPY WITH PROPOFOL N/A 04/03/2018   Procedure: COLONOSCOPY WITH PROPOFOL;  Surgeon: Jonathon Bellows, MD;  Location: [facility];  Service: Gastroenterology;  Laterality: N/A;   HAND SURGERY     NO PAST SURGERIES     SKIN CANCER EXCISION      Social History   Socioeconomic History   Marital status: Divorced    Spouse name: Not on file   Number of children: Not on file   Years of education: Not on file   Highest education level: Not on file  Occupational History   Not on file  Tobacco Use   Smoking status: Never   Smokeless tobacco: Never  Vaping Use   Vaping Use: Never used  Substance and Sexual Activity   Alcohol use: Yes    Alcohol/week: 3.0 standard drinks of alcohol    Types: 3 Glasses of wine per week    Comment: weekly   Drug use: No   Sexual activity: Not  Currently    Birth control/protection: Post-menopausal  Other Topics Concern   Not on file  Social History Narrative   Not on file   Social Determinants of Health   Financial Resource Strain: Not on file  Food Insecurity: Not on file  Transportation Needs: Not on file  Physical Activity: Sufficiently Active (03/04/2018)   Exercise Vital Sign    Days of Exercise per Week: 3 days    Minutes of Exercise per Session: 60 min  Stress: Not on file  Social Connections: Not on file  Intimate Partner Violence: Not on file    Current Outpatient Medications on File Prior to Visit  Medication Sig Dispense Refill   azithromycin (ZITHROMAX) 200 MG/5ML suspension Take 5 mLs (200 mg total) by mouth daily. 22.5 mL 0   benzonatate  (TESSALON) 100 MG capsule Take 1 capsule (100 mg total) by mouth 3 (three) times daily as needed for cough. 21 capsule 0   Calcium Carbonate-Vitamin D (CALCIUM + D PO) Take by mouth daily.     eletriptan (RELPAX) 40 MG tablet One tablet by mouth at onset of headache. May repeat in 2 hours if headache persists or recurs. may repeat in 2 hours if necessary     Multiple Vitamin (MULTIVITAMIN) tablet Take 1 tablet by mouth daily.     norethindrone-ethinyl estradiol (FEMHRT LOW DOSE) 0.5-2.5 MG-MCG tablet Take 1 tablet by mouth daily. 90 tablet 3   promethazine-dextromethorphan (PROMETHAZINE-DM) 6.25-15 MG/5ML syrup Take 5 mLs by mouth 2 (two) times daily as needed for cough. 118 mL 0   No current facility-administered medications on file prior to visit.    Allergies  Allergen Reactions   Dilantin [Phenytoin Sodium Extended]       Review of Systems ROS Review of Systems - General ROS: negative for - chills, fatigue, fever, hot flashes, night sweats, weight gain or weight loss Psychological ROS: negative for - anxiety, decreased libido, depression, mood swings, physical abuse or sexual abuse Ophthalmic ROS: negative for - blurry vision, eye pain or loss of vision ENT ROS: negative for - headaches, hearing change, visual changes or vocal changes Allergy and Immunology ROS: negative for - hives, itchy/watery eyes or seasonal allergies Hematological and Lymphatic ROS: negative for - bleeding problems, bruising, swollen lymph nodes or weight loss Endocrine ROS: negative for - galactorrhea, hair pattern changes, hot flashes, malaise/lethargy, mood swings, palpitations, polydipsia/polyuria, skin changes, temperature intolerance or unexpected weight changes Breast ROS: negative for - new or changing breast lumps or nipple discharge Respiratory ROS: negative for - cough or shortness of breath Cardiovascular ROS: negative for - chest pain, irregular heartbeat, palpitations or shortness of  breath Gastrointestinal ROS: no abdominal pain, change in bowel habits, or black or bloody stools Genito-Urinary ROS: no dysuria, trouble voiding, or hematuria Musculoskeletal ROS: negative for - joint pain or joint stiffness Neurological ROS: negative for - bowel and bladder control changes Dermatological ROS: negative for rash and skin lesion changes   Objective:   LMP 03/05/2013  CONSTITUTIONAL: Well-developed, well-nourished female in no acute distress.  PSYCHIATRIC: Normal mood and affect. Normal behavior. Normal judgment and thought content. [place]: Alert and oriented to person, place, and time. Normal muscle tone coordination. No cranial nerve deficit noted. HENT:  Normocephalic, atraumatic, External right and left ear normal. Oropharynx is clear and moist EYES: Conjunctivae and EOM are normal. Pupils are equal, round, and reactive to light. No scleral icterus.  NECK: Normal range of motion, supple, no masses.  Normal thyroid.  SKIN: Skin  is warm and dry. No rash noted. Not diaphoretic. No erythema. No pallor. CARDIOVASCULAR: Normal heart rate noted, regular rhythm, no murmur. RESPIRATORY: Clear to auscultation bilaterally. Effort and breath sounds normal, no problems with respiration noted. BREASTS: Symmetric in size. No masses, skin changes, nipple drainage, or lymphadenopathy. ABDOMEN: Soft, normal bowel sounds, no distention noted.  No tenderness, rebound or guarding.  BLADDER: Normal PELVIC:  Bladder {:311640}  Urethra: {:311719}  Vulva: {:311722}  Vagina: {:311643}  Cervix: {:311644}  Uterus: {:311718}  Adnexa: {:311645}  RV: {Blank multiple:19196::"External Exam NormaI","No Rectal Masses","Normal Sphincter tone"}  MUSCULOSKELETAL: Normal range of motion. No tenderness.  No cyanosis, clubbing, or edema.  2+ distal pulses. LYMPHATIC: No Axillary, Supraclavicular, or Inguinal Adenopathy.   Labs: Lab Results  Component Value Date   WBC 6.2 09/07/2020   HGB 13.6  09/07/2020   HCT 41.4 09/07/2020   MCV 96 09/07/2020   PLT 305 09/07/2020    Lab Results  Component Value Date   CREATININE 0.81 09/07/2020   BUN 12 09/07/2020   NA 140 09/07/2020   K 4.7 09/07/2020   CL 103 09/07/2020   CO2 22 09/07/2020    Lab Results  Component Value Date   ALT 18 09/07/2020   AST 18 09/07/2020   ALKPHOS 57 09/07/2020   BILITOT 0.8 09/07/2020    Lab Results  Component Value Date   CHOL 241 (H) 09/07/2020   HDL 73 09/07/2020   LDLCALC 153 (H) 09/07/2020   TRIG 85 09/07/2020   CHOLHDL 3.3 09/07/2020    Lab Results  Component Value Date   TSH 1.380 09/07/2020    Lab Results  Component Value Date   HGBA1C 5.6 09/07/2020     Assessment:   No diagnosis found.   Plan:  Pap:  UTD Mammogram:  UTD Colon Screening:   UTD Labs: {Blank multiple:19196::"Lipid 1","FBS","TSH","Hemoglobin A1C","Vit D Level""***"} Routine preventative health maintenance measures emphasized: Exercise/Diet/Weight control, Tobacco Warnings, Alcohol/Substance use risks, Stress Management, Peer Pressure Issues, and Safe Sex COVID Vaccination status: Return to [place], [place]

## 2022-09-17 ENCOUNTER — Ambulatory Visit (INDEPENDENT_AMBULATORY_CARE_PROVIDER_SITE_OTHER): Payer: 59 | Admitting: Obstetrics and Gynecology

## 2022-09-17 ENCOUNTER — Encounter: Payer: Self-pay | Admitting: Obstetrics and Gynecology

## 2022-09-17 VITALS — BP 105/61 | HR 77 | Resp 16 | Ht 67.0 in | Wt 139.0 lb

## 2022-09-17 DIAGNOSIS — Z1231 Encounter for screening mammogram for malignant neoplasm of breast: Secondary | ICD-10-CM

## 2022-09-17 DIAGNOSIS — Z23 Encounter for immunization: Secondary | ICD-10-CM

## 2022-09-17 DIAGNOSIS — Z01419 Encounter for gynecological examination (general) (routine) without abnormal findings: Secondary | ICD-10-CM

## 2022-09-17 DIAGNOSIS — I10 Essential (primary) hypertension: Secondary | ICD-10-CM

## 2022-09-17 DIAGNOSIS — E785 Hyperlipidemia, unspecified: Secondary | ICD-10-CM

## 2022-11-02 DIAGNOSIS — S8392XA Sprain of unspecified site of left knee, initial encounter: Secondary | ICD-10-CM | POA: Diagnosis not present

## 2023-05-21 DIAGNOSIS — Z0000 Encounter for general adult medical examination without abnormal findings: Secondary | ICD-10-CM | POA: Diagnosis not present

## 2023-05-26 DIAGNOSIS — Z0000 Encounter for general adult medical examination without abnormal findings: Secondary | ICD-10-CM | POA: Diagnosis not present

## 2023-05-26 DIAGNOSIS — K219 Gastro-esophageal reflux disease without esophagitis: Secondary | ICD-10-CM | POA: Diagnosis not present

## 2023-05-26 DIAGNOSIS — N951 Menopausal and female climacteric states: Secondary | ICD-10-CM | POA: Diagnosis not present

## 2023-05-26 DIAGNOSIS — G43909 Migraine, unspecified, not intractable, without status migrainosus: Secondary | ICD-10-CM | POA: Diagnosis not present

## 2023-05-26 DIAGNOSIS — R7989 Other specified abnormal findings of blood chemistry: Secondary | ICD-10-CM | POA: Diagnosis not present

## 2023-06-25 DIAGNOSIS — R7989 Other specified abnormal findings of blood chemistry: Secondary | ICD-10-CM | POA: Diagnosis not present

## 2023-07-19 DIAGNOSIS — S8001XA Contusion of right knee, initial encounter: Secondary | ICD-10-CM | POA: Diagnosis not present

## 2023-07-19 DIAGNOSIS — M25461 Effusion, right knee: Secondary | ICD-10-CM | POA: Diagnosis not present

## 2023-07-22 DIAGNOSIS — S8001XA Contusion of right knee, initial encounter: Secondary | ICD-10-CM | POA: Diagnosis not present

## 2023-07-22 DIAGNOSIS — M25461 Effusion, right knee: Secondary | ICD-10-CM | POA: Diagnosis not present

## 2023-07-24 DIAGNOSIS — S83241A Other tear of medial meniscus, current injury, right knee, initial encounter: Secondary | ICD-10-CM | POA: Diagnosis not present

## 2023-07-24 DIAGNOSIS — S83411A Sprain of medial collateral ligament of right knee, initial encounter: Secondary | ICD-10-CM | POA: Diagnosis not present

## 2023-08-18 DIAGNOSIS — D2272 Melanocytic nevi of left lower limb, including hip: Secondary | ICD-10-CM | POA: Diagnosis not present

## 2023-08-18 DIAGNOSIS — D2271 Melanocytic nevi of right lower limb, including hip: Secondary | ICD-10-CM | POA: Diagnosis not present

## 2023-08-18 DIAGNOSIS — D2262 Melanocytic nevi of left upper limb, including shoulder: Secondary | ICD-10-CM | POA: Diagnosis not present

## 2023-08-18 DIAGNOSIS — L820 Inflamed seborrheic keratosis: Secondary | ICD-10-CM | POA: Diagnosis not present

## 2023-08-18 DIAGNOSIS — Z85828 Personal history of other malignant neoplasm of skin: Secondary | ICD-10-CM | POA: Diagnosis not present

## 2023-08-18 DIAGNOSIS — D2261 Melanocytic nevi of right upper limb, including shoulder: Secondary | ICD-10-CM | POA: Diagnosis not present

## 2023-08-18 DIAGNOSIS — D225 Melanocytic nevi of trunk: Secondary | ICD-10-CM | POA: Diagnosis not present

## 2023-08-18 DIAGNOSIS — D485 Neoplasm of uncertain behavior of skin: Secondary | ICD-10-CM | POA: Diagnosis not present

## 2023-08-18 DIAGNOSIS — X32XXXA Exposure to sunlight, initial encounter: Secondary | ICD-10-CM | POA: Diagnosis not present

## 2023-08-18 DIAGNOSIS — L111 Transient acantholytic dermatosis [Grover]: Secondary | ICD-10-CM | POA: Diagnosis not present

## 2023-08-18 DIAGNOSIS — L570 Actinic keratosis: Secondary | ICD-10-CM | POA: Diagnosis not present

## 2023-08-18 DIAGNOSIS — L538 Other specified erythematous conditions: Secondary | ICD-10-CM | POA: Diagnosis not present

## 2023-08-21 DIAGNOSIS — S83241A Other tear of medial meniscus, current injury, right knee, initial encounter: Secondary | ICD-10-CM | POA: Diagnosis not present

## 2023-09-08 ENCOUNTER — Other Ambulatory Visit: Payer: Self-pay | Admitting: Obstetrics and Gynecology

## 2023-09-08 DIAGNOSIS — Z1231 Encounter for screening mammogram for malignant neoplasm of breast: Secondary | ICD-10-CM

## 2023-09-10 DIAGNOSIS — S83241D Other tear of medial meniscus, current injury, right knee, subsequent encounter: Secondary | ICD-10-CM | POA: Diagnosis not present

## 2023-09-11 ENCOUNTER — Ambulatory Visit
Admission: RE | Admit: 2023-09-11 | Discharge: 2023-09-11 | Disposition: A | Discharge status: Home / Self Care | Payer: 59 | Source: Ambulatory Visit | Attending: Obstetrics and Gynecology | Admitting: Obstetrics and Gynecology

## 2023-09-11 DIAGNOSIS — Z1231 Encounter for screening mammogram for malignant neoplasm of breast: Secondary | ICD-10-CM | POA: Diagnosis not present

## 2023-09-25 DIAGNOSIS — R7989 Other specified abnormal findings of blood chemistry: Secondary | ICD-10-CM | POA: Diagnosis not present

## 2023-09-29 DIAGNOSIS — C44729 Squamous cell carcinoma of skin of left lower limb, including hip: Secondary | ICD-10-CM | POA: Diagnosis not present

## 2023-10-15 DIAGNOSIS — S83241D Other tear of medial meniscus, current injury, right knee, subsequent encounter: Secondary | ICD-10-CM | POA: Diagnosis not present

## 2024-02-19 DIAGNOSIS — D2262 Melanocytic nevi of left upper limb, including shoulder: Secondary | ICD-10-CM | POA: Diagnosis not present

## 2024-02-19 DIAGNOSIS — L111 Transient acantholytic dermatosis [Grover]: Secondary | ICD-10-CM | POA: Diagnosis not present

## 2024-02-19 DIAGNOSIS — L570 Actinic keratosis: Secondary | ICD-10-CM | POA: Diagnosis not present

## 2024-02-19 DIAGNOSIS — D2272 Melanocytic nevi of left lower limb, including hip: Secondary | ICD-10-CM | POA: Diagnosis not present

## 2024-02-19 DIAGNOSIS — D2261 Melanocytic nevi of right upper limb, including shoulder: Secondary | ICD-10-CM | POA: Diagnosis not present

## 2024-02-19 DIAGNOSIS — Z85828 Personal history of other malignant neoplasm of skin: Secondary | ICD-10-CM | POA: Diagnosis not present

## 2024-02-19 DIAGNOSIS — D225 Melanocytic nevi of trunk: Secondary | ICD-10-CM | POA: Diagnosis not present

## 2024-04-07 DIAGNOSIS — R079 Chest pain, unspecified: Secondary | ICD-10-CM | POA: Diagnosis not present

## 2024-04-07 DIAGNOSIS — I10 Essential (primary) hypertension: Secondary | ICD-10-CM | POA: Diagnosis not present

## 2024-04-07 DIAGNOSIS — R0602 Shortness of breath: Secondary | ICD-10-CM | POA: Diagnosis not present

## 2024-04-07 DIAGNOSIS — R519 Headache, unspecified: Secondary | ICD-10-CM | POA: Diagnosis not present

## 2024-05-20 DIAGNOSIS — Z0000 Encounter for general adult medical examination without abnormal findings: Secondary | ICD-10-CM | POA: Diagnosis not present

## 2024-05-24 NOTE — Patient Instructions (Signed)
 Preventive Care 58-64 Years Old, Female  Preventive care refers to lifestyle choices and visits with your health care provider that can promote health and wellness. Preventive care visits are also called wellness exams.  What can I expect for my preventive care visit?  Counseling  Your health care provider may ask you questions about your:  Medical history, including:  Past medical problems.  Family medical history.  Pregnancy history.  Current health, including:  Menstrual cycle.  Method of birth control.  Emotional well-being.  Home life and relationship well-being.  Sexual activity and sexual health.  Lifestyle, including:  Alcohol, nicotine or tobacco, and drug use.  Access to firearms.  Diet, exercise, and sleep habits.  Work and work Astronomer.  Sunscreen use.  Safety issues such as seatbelt and bike helmet use.  Physical exam  Your health care provider will check your:  Height and weight. These may be used to calculate your BMI (body mass index). BMI is a measurement that tells if you are at a healthy weight.  Waist circumference. This measures the distance around your waistline. This measurement also tells if you are at a healthy weight and may help predict your risk of certain diseases, such as type 2 diabetes and high blood pressure.  Heart rate and blood pressure.  Body temperature.  Skin for abnormal spots.  What immunizations do I need?    Vaccines are usually given at various ages, according to a schedule. Your health care provider will recommend vaccines for you based on your age, medical history, and lifestyle or other factors, such as travel or where you work.  What tests do I need?  Screening  Your health care provider may recommend screening tests for certain conditions. This may include:  Lipid and cholesterol levels.  Diabetes screening. This is done by checking your blood sugar (glucose) after you have not eaten for a while (fasting).  Pelvic exam and Pap test.  Hepatitis B test.  Hepatitis C  test.  HIV (human immunodeficiency virus) test.  STI (sexually transmitted infection) testing, if you are at risk.  Lung cancer screening.  Colorectal cancer screening.  Mammogram. Talk with your health care provider about when you should start having regular mammograms. This may depend on whether you have a family history of breast cancer.  BRCA-related cancer screening. This may be done if you have a family history of breast, ovarian, tubal, or peritoneal cancers.  Bone density scan. This is done to screen for osteoporosis.  Talk with your health care provider about your test results, treatment options, and if necessary, the need for more tests.  Follow these instructions at home:  Eating and drinking    Eat a diet that includes fresh fruits and vegetables, whole grains, lean protein, and low-fat dairy products.  Take vitamin and mineral supplements as recommended by your health care provider.  Do not drink alcohol if:  Your health care provider tells you not to drink.  You are pregnant, may be pregnant, or are planning to become pregnant.  If you drink alcohol:  Limit how much you have to 0-1 drink a day.  Know how much alcohol is in your drink. In the [place] one drink equals one 12 oz bottle of beer (355 mL), one 5 oz glass of wine (148 mL), or one 1 oz glass of hard liquor (44 mL).  Lifestyle  Brush your teeth every morning and night with fluoride toothpaste. Floss one time each day.  Exercise for at least  30 minutes 5 or more days each week.  Do not use any products that contain nicotine or tobacco. These products include cigarettes, chewing tobacco, and vaping devices, such as e-cigarettes. If you need help quitting, ask your health care provider.  Do not use drugs.  If you are sexually active, practice safe sex. Use a condom or other form of protection to prevent STIs.  If you do not wish to become pregnant, use a form of birth control. If you plan to become pregnant, see your health care provider for a  prepregnancy visit.  Take aspirin only as told by your health care provider. Make sure that you understand how much to take and what form to take. Work with your health care provider to find out whether it is safe and beneficial for you to take aspirin daily.  Find healthy ways to manage stress, such as:  Meditation, yoga, or listening to music.  Journaling.  Talking to a trusted person.  Spending time with friends and family.  Minimize exposure to UV radiation to reduce your risk of skin cancer.  Safety  Always wear your seat belt while driving or riding in a vehicle.  Do not drive:  If you have been drinking alcohol. Do not ride with someone who has been drinking.  When you are tired or distracted.  While texting.  If you have been using any mind-altering substances or drugs.  Wear a helmet and other protective equipment during sports activities.  If you have firearms in your house, make sure you follow all gun safety procedures.  Seek help if you have been physically or sexually abused.  What's next?  Visit your health care provider once a year for an annual wellness visit.  Ask your health care provider how often you should have your eyes and teeth checked.  Stay up to date on all vaccines.  This information is not intended to replace advice given to you by your health care provider. Make sure you discuss any questions you have with your health care provider.  Document Revised: 03/21/2021 Document Reviewed: 03/21/2021  Elsevier Patient Education  2024 [employer].

## 2024-05-24 NOTE — Progress Notes (Signed)
 ANNUAL PREVENTATIVE CARE GYNECOLOGY  ENCOUNTER NOTE  SUBJECTIVE:       Charlotte Watkins is a 63 y.o. G15P3003 female here for a routine annual gynecologic exam. The patient is sexually active. The patient is taking hormone replacement therapy. Patient denies post-menopausal vaginal bleeding. Family history of breast, uterine, ovarian cancer: no. The patient wears seatbelts: yes. The patient participates in regular exercise: yes. Has the patient ever been transfused or tattooed?: no. The patient reports that there is not domestic violence in her life. Has the patient completed the Gardasil vaccine? no.   Current complaints: 1.  Patient is concerned her HRT is becoming less effective over the last few months.  She has been having trouble sleeping, experiencing racing thoughts, feels a little warmer at night, not hot flashes, and brain fog. PCP ordered her a sleep study, but can't schedule until April '26.   2. Vaginal dryness with intercourse, very uncomfortable   Gynecologic History Patient's last menstrual period was 03/05/2013. Contraception: post menopausal status Last Pap: 07/12/2020. Results were: normal History of abnormal pap: none History of STIs: none Last Mammogram: 09/2023. Results were: normal Last Colonoscopy: 04/03/2018 Last Dexa Scan:   PHQ-2:     09/17/2022   11:30 AM  Depression screen PHQ 2/9  Decreased Interest 0  Down, Depressed, Hopeless 0  PHQ - 2 Score 0    Obstetric History OB History  Gravida Para Term Preterm AB Living  3 3 3   3   SAB IAB Ectopic Multiple Live Births      3    # Outcome Date GA Lbr Len/2nd Weight Sex Type Anes PTL Lv  3 Term 1990   6 lb 11.2 oz (3.039 kg) M Vag-Spont   LIV  2 Term 1989   8 lb 1.6 oz (3.674 kg) F Vag-Spont   LIV  1 Term 1986   6 lb 14.4 oz (3.13 kg) F Vag-Spont   LIV    Past Medical History:  Diagnosis Date   Allergic rhinitis, cause unspecified    Dysmenorrhea    Excessive or frequent menstruation     Hypertension    in the past, better after hormone therapy   Leiomyoma of uterus, unspecified    Migraine, unspecified, without mention of intractable migraine without mention of status migrainosus    migraines, better after starting hormone therapy   Palpitations    Personal history of other genital system and obstetric disorders(V13.29)    Seizure disorder ([facility])    Unspecified epilepsy without mention of intractable epilepsy    last Sz over 25 years ago   Unspecified epilepsy without mention of intractable epilepsy    Unspecified symptom associated with female genital organs     Family History  Problem Relation Age of Onset   Heart disease Father    Heart attack Father    Diabetes Father    Parkinson's disease Mother    Breast cancer Neg Hx    Ovarian cancer Neg Hx    Colon cancer Neg Hx     Past Surgical History:  Procedure Laterality Date   COLONOSCOPY WITH PROPOFOL  N/A 04/03/2018   Procedure: COLONOSCOPY WITH PROPOFOL ;  Surgeon: Therisa Bi, MD;  Location: [facility];  Service: Gastroenterology;  Laterality: N/A;   HAND SURGERY     NO PAST SURGERIES     SKIN CANCER EXCISION      Social History   Socioeconomic History   Marital status: Divorced    Spouse name: Not on file  Number of children: Not on file   Years of education: Not on file   Highest education level: Not on file  Occupational History   Not on file  Tobacco Use   Smoking status: Never   Smokeless tobacco: Never  Vaping Use   Vaping status: Never Used  Substance and Sexual Activity   Alcohol use: Yes    Alcohol/week: 3.0 standard drinks of alcohol    Types: 3 Glasses of wine per week    Comment: weekly   Drug use: No   Sexual activity: Not Currently    Birth control/protection: Post-menopausal  Other Topics Concern   Not on file  Social History Narrative   Not on file   Social Drivers of Health   Financial Resource Strain: Low Risk  (05/25/2024)   Received from [facility]   Overall Financial Resource Strain (CARDIA)    Difficulty of Paying Living Expenses: Not very hard  Food Insecurity: No Food Insecurity (05/25/2024)   Received from [facility]   Hunger Vital Sign    Within the past 12 months, you worried that your food would run out before you got the money to buy more.: Never true    Within the past 12 months, the food you bought just didn't last and you didn't have money to get more.: Never true  Transportation Needs: No Transportation Needs (05/25/2024)   Received from [facility] - Transportation    In the past 12 months, has lack of transportation kept you from medical appointments or from getting medications?: No    Lack of Transportation (Non-Medical): No  Physical Activity: Sufficiently Active (03/04/2018)   Exercise Vital Sign    Days of Exercise per Week: 3 days    Minutes of Exercise per Session: 60 min  Stress: Not on file  Social Connections: Not on file  Intimate Partner Violence: Not on file    Current Outpatient Medications on File Prior to Visit  Medication Sig Dispense Refill   Calcium Carbonate-Vitamin D (CALCIUM + D PO) Take by mouth daily.     eletriptan (RELPAX) 40 MG tablet One tablet by mouth at onset of headache. May repeat in 2 hours if headache persists or recurs. may repeat in 2 hours if necessary     losartan (COZAAR) 50 MG tablet Take 50 mg by mouth daily.     meloxicam  (MOBIC ) 7.5 MG tablet Take 1 tablet by mouth daily.     Multiple Vitamin (MULTIVITAMIN) tablet Take 1 tablet by mouth daily.     omeprazole (PRILOSEC) 40 MG capsule Take 40 mg by mouth daily.     olmesartan-hydrochlorothiazide (BENICAR HCT) 40-12.5 MG tablet Take 1 tablet by mouth daily. (Patient not taking: Reported on 06/04/2024)     No current facility-administered medications on file prior to visit.    Allergies  Allergen Reactions   Dilantin [Phenytoin Sodium Extended]    Review of  Systems ROS As per HPI   OBJECTIVE:   BP (!) 126/93   Pulse 86   Ht 5' 6 (1.676 m)   Wt 150 lb 12.8 oz (68.4 kg)   LMP 03/05/2013   BMI 24.34 kg/m   CONSTITUTIONAL: Well-developed, well-nourished female in no acute distress.  PSYCHIATRIC: Normal mood and affect. Normal behavior. Normal judgment and thought content. NEUROLGIC: Alert and oriented to person, place, and time. Normal muscle tone coordination. No cranial nerve deficit noted. HENT:  Normocephalic, atraumatic, External right and left  ear normal. Oropharynx is clear and moist EYES: Conjunctivae and EOM are normal. No scleral icterus.  NECK: Normal range of motion, supple, no masses.  Normal thyroid.  SKIN: Skin is warm and dry. No rash noted. Not diaphoretic. No erythema. No pallor. CARDIOVASCULAR: Normal heart rate noted, regular rhythm, no murmur. RESPIRATORY: Clear to auscultation bilaterally. Effort and breath sounds normal, no problems with respiration noted. BREASTS: Symmetric in size. No masses, skin changes, nipple drainage, or lymphadenopathy. ABDOMEN: Soft, normal bowel sounds, no distention noted.  No tenderness, rebound or guarding.  PELVIC:  Bladder no bladder distension noted  Urethra: normal appearing urethra with no masses, tenderness or lesions  Vulva: normal appearing vulva with no masses, tenderness or lesions  Vagina: normal appearing vagina with normal color and discharge, no lesions  Cervix: normal appearing cervix without discharge or lesions  Uterus: uterus is normal size, shape, consistency and nontender  Adnexa: normal adnexa in size, nontender and no masses  RV: External Exam NormaI, No Rectal Masses, and Normal Sphincter tone  MUSCULOSKELETAL: Normal range of motion. No tenderness.  No cyanosis, clubbing, or edema.  2+ distal pulses. LYMPHATIC: No Axillary, Supraclavicular, or Inguinal Adenopathy.  Labs: Lab Results  Component Value Date   WBC 6.2 09/07/2020   HGB 13.6 09/07/2020   HCT  41.4 09/07/2020   MCV 96 09/07/2020   PLT 305 09/07/2020    Lab Results  Component Value Date   CREATININE 0.81 09/07/2020   BUN 12 09/07/2020   NA 140 09/07/2020   K 4.7 09/07/2020   CL 103 09/07/2020   CO2 22 09/07/2020    Lab Results  Component Value Date   ALT 18 09/07/2020   AST 18 09/07/2020   ALKPHOS 57 09/07/2020   BILITOT 0.8 09/07/2020    Lab Results  Component Value Date   CHOL 241 (H) 09/07/2020   HDL 73 09/07/2020   LDLCALC 153 (H) 09/07/2020   TRIG 85 09/07/2020   CHOLHDL 3.3 09/07/2020    Lab Results  Component Value Date   TSH 1.380 09/07/2020    Lab Results  Component Value Date   HGBA1C 5.6 09/07/2020     ASSESSMENT:   1. Well woman exam with routine gynecological exam   2. Breast cancer screening by mammogram   3. Vaginal dryness, menopausal   4. Menopausal syndrome on hormone replacement therapy      PLAN:   Charlotte Watkins is a 63 y.o. G42P3003 female here today for her annual exam, doing well.  Pap: done with cotesting today Mammogram: discussed rationale for yearly screens, her PCP had said Q22yrs; being on HRT, and with the newer recommendations, after discussion, she agrees to yearly. Colon: PCP  Labs: PCP PHQ-2 = 0 Contraception: post-menopausal Healthy lifestyle modifications discussed: multivitamin, diet, exercise, sunscreen, tobacco and alcohol use. Emphasized importance of regular physical activity.  Calcium and Vit D recommendation reviewed.  All questions answered to patient's satisfaction.   Menopausal syndrome on HRT / Vaginal dryness -Increase FEMHRT to 1/5 for 3 mos, pt will msg if liking higher dose. If not, will put back on 0.5/2.5. Discussed R/B/A of HRT and pt accepts.  -Rx for Estrace  for dryness, nightly x 2wks, then twice weekly. Can also add vaginal moisturizers 3-5 times a week.   Follow up 1 yr for annual, sooner prn.    Estil Mangle, DO [facility]

## 2024-05-27 DIAGNOSIS — G4700 Insomnia, unspecified: Secondary | ICD-10-CM | POA: Diagnosis not present

## 2024-05-27 DIAGNOSIS — R5383 Other fatigue: Secondary | ICD-10-CM | POA: Diagnosis not present

## 2024-05-27 DIAGNOSIS — I10 Essential (primary) hypertension: Secondary | ICD-10-CM | POA: Diagnosis not present

## 2024-05-27 DIAGNOSIS — M2550 Pain in unspecified joint: Secondary | ICD-10-CM | POA: Diagnosis not present

## 2024-05-27 DIAGNOSIS — F439 Reaction to severe stress, unspecified: Secondary | ICD-10-CM | POA: Diagnosis not present

## 2024-05-27 DIAGNOSIS — Z1331 Encounter for screening for depression: Secondary | ICD-10-CM | POA: Diagnosis not present

## 2024-05-27 DIAGNOSIS — N951 Menopausal and female climacteric states: Secondary | ICD-10-CM | POA: Diagnosis not present

## 2024-05-27 DIAGNOSIS — R519 Headache, unspecified: Secondary | ICD-10-CM | POA: Diagnosis not present

## 2024-05-27 DIAGNOSIS — R5381 Other malaise: Secondary | ICD-10-CM | POA: Diagnosis not present

## 2024-05-27 DIAGNOSIS — R400 Somnolence: Secondary | ICD-10-CM | POA: Diagnosis not present

## 2024-05-27 DIAGNOSIS — Z0000 Encounter for general adult medical examination without abnormal findings: Secondary | ICD-10-CM | POA: Diagnosis not present

## 2024-05-31 DIAGNOSIS — R5383 Other fatigue: Secondary | ICD-10-CM | POA: Diagnosis not present

## 2024-05-31 DIAGNOSIS — R5381 Other malaise: Secondary | ICD-10-CM | POA: Diagnosis not present

## 2024-05-31 DIAGNOSIS — M2550 Pain in unspecified joint: Secondary | ICD-10-CM | POA: Diagnosis not present

## 2024-06-04 ENCOUNTER — Ambulatory Visit: Admitting: Obstetrics

## 2024-06-04 ENCOUNTER — Encounter: Payer: Self-pay | Admitting: Obstetrics

## 2024-06-04 VITALS — BP 126/93 | HR 86 | Ht 66.0 in | Wt 150.8 lb

## 2024-06-04 DIAGNOSIS — Z79890 Hormone replacement therapy (postmenopausal): Secondary | ICD-10-CM

## 2024-06-04 DIAGNOSIS — N951 Menopausal and female climacteric states: Secondary | ICD-10-CM | POA: Diagnosis not present

## 2024-06-04 DIAGNOSIS — Z01419 Encounter for gynecological examination (general) (routine) without abnormal findings: Secondary | ICD-10-CM | POA: Diagnosis not present

## 2024-06-04 DIAGNOSIS — S60229A Contusion of unspecified hand, initial encounter: Secondary | ICD-10-CM | POA: Insufficient documentation

## 2024-06-04 DIAGNOSIS — Z1231 Encounter for screening mammogram for malignant neoplasm of breast: Secondary | ICD-10-CM

## 2024-06-04 MED ORDER — ESTRADIOL 0.1 MG/GM VA CREA
1.0000 | TOPICAL_CREAM | VAGINAL | 11 refills | Status: AC
Start: 2024-06-04 — End: ?

## 2024-06-04 MED ORDER — NORETHINDRONE-ETH ESTRADIOL 1-5 MG-MCG PO TABS: 1.0000 | ORAL_TABLET | Freq: Every day | ORAL | 0 refills | Status: AC

## 2024-06-08 DIAGNOSIS — M8588 Other specified disorders of bone density and structure, other site: Secondary | ICD-10-CM | POA: Diagnosis not present

## 2024-06-16 ENCOUNTER — Emergency Department
Admission: EM | Admit: 2024-06-16 | Discharge: 2024-06-16 | Disposition: A | Discharge status: Home / Self Care | Attending: Emergency Medicine | Admitting: Emergency Medicine

## 2024-06-16 ENCOUNTER — Other Ambulatory Visit: Payer: Self-pay

## 2024-06-16 ENCOUNTER — Encounter: Payer: Self-pay | Admitting: Emergency Medicine

## 2024-06-16 DIAGNOSIS — R42 Dizziness and giddiness: Secondary | ICD-10-CM | POA: Diagnosis not present

## 2024-06-16 LAB — COMPREHENSIVE METABOLIC PANEL WITH GFR
ALT: 26 U/L (ref 0–44)
AST: 21 U/L (ref 15–41)
Albumin: 4 g/dL (ref 3.5–5.0)
Alkaline Phosphatase: 37 U/L — ABNORMAL LOW (ref 38–126)
Anion gap: 9 (ref 5–15)
BUN: 9 mg/dL (ref 8–23)
CO2: 25 mmol/L (ref 22–32)
Calcium: 9 mg/dL (ref 8.9–10.3)
Chloride: 104 mmol/L (ref 98–111)
Creatinine, Ser: 0.68 mg/dL (ref 0.44–1.00)
GFR, Estimated: >60 mL/min (ref >60)
Glucose, Bld: 95 mg/dL (ref 70–99)
Potassium: 4.1 mmol/L (ref 3.5–5.1)
Sodium: 138 mmol/L (ref 135–145)
Total Bilirubin: 1.4 mg/dL — ABNORMAL HIGH (ref 0.0–1.2)
Total Protein: 6.9 g/dL (ref 6.5–8.1)

## 2024-06-16 LAB — CBC
HCT: 40 % (ref 36.0–46.0)
Hemoglobin: 13.9 g/dL (ref 12.0–15.0)
MCH: 32.3 pg (ref 26.0–34.0)
MCHC: 34.8 g/dL (ref 30.0–36.0)
MCV: 93 fL (ref 80.0–100.0)
Platelets: 331 K/uL (ref 150–400)
RBC: 4.3 MIL/uL (ref 3.87–5.11)
RDW: 12.8 % (ref 11.5–15.5)
WBC: 9.2 K/uL (ref 4.0–10.5)
nRBC: 0 % (ref 0.0–0.2)

## 2024-06-16 MED ORDER — MECLIZINE HCL 25 MG PO TABS: 25.0000 mg | ORAL_TABLET | Freq: Three times a day (TID) | ORAL | 0 refills | Status: AC | PRN

## 2024-06-16 MED ORDER — DIAZEPAM 5 MG PO TABS
5.0000 mg | ORAL_TABLET | Freq: Once | ORAL | Status: AC
  Administered 2024-06-16: 5 mg via ORAL
  Filled 2024-06-16: qty 1

## 2024-06-16 MED ORDER — MECLIZINE HCL 25 MG PO TABS
25.0000 mg | ORAL_TABLET | Freq: Once | ORAL | Status: AC
  Administered 2024-06-16: 25 mg via ORAL
  Filled 2024-06-16: qty 1

## 2024-06-16 MED ORDER — SODIUM CHLORIDE 0.9 % IV BOLUS
1000.0000 mL | Freq: Once | INTRAVENOUS | Status: AC
Start: 2024-06-16 — End: 2024-06-16
  Administered 2024-06-16: 1000 mL via INTRAVENOUS

## 2024-06-16 MED ORDER — DIAZEPAM 5 MG PO TABS: 5.0000 mg | ORAL_TABLET | Freq: Three times a day (TID) | ORAL | 0 refills | Status: AC | PRN

## 2024-06-16 NOTE — Discharge Instructions (Signed)
 You were seen in the ER today for your dizziness.  We fortunately did not and emergency cause for this.  I suspect you have something called vertigo.  I have sent a prescription for meclizine  to medicine to help with your dizziness as well as a short course of Valium  for breakthrough symptoms.  The Valium  can make you drowsy, do not drive or operate machinery when taking this.  Follow-up with your primary care doctor or ENT for further evaluation.  Return to the ER for new or worsening symptoms including severe headache, new numbness, tingling, focal weakness, or any other new or concerning symptoms.

## 2024-06-16 NOTE — ED Notes (Signed)
 Patient assisted to the edge of bed. Patient immediately began feeling dizzy. No other symptoms noted by patient at this time. MD Ray made aware.

## 2024-06-16 NOTE — ED Notes (Signed)
 Patient ambulated around room without any dizziness. Patient did complain of slight lightheadness upon getting back in bed. MD Ray made aware.

## 2024-06-16 NOTE — ED Provider Notes (Signed)
 [facility] Provider Note    Event Date/Time   First MD Initiated Contact with Patient 06/16/24 1148     (approximate)   History   Dizziness   HPI  Charlotte Watkins is a 63 year old female presenting to the emergency department for evaluation of dizziness.  Patient reports she woke up this morning with a sensation of the room spinning.  It has been waxing and waning since that time, but was unable to walk due to the degree of her dizziness this morning and had to crawl to the bathroom.  Denies history of similar in the past.  No head trauma, new numbness, tingling, focal weakness.     Physical Exam   Triage Vital Signs: ED Triage Vitals  Encounter Vitals Group     BP 06/16/24 0846 (!) 163/90     Girls Systolic BP Percentile --      Girls Diastolic BP Percentile --      Boys Systolic BP Percentile --      Boys Diastolic BP Percentile --      Pulse Rate 06/16/24 0846 80     Resp 06/16/24 0846 17     Temp 06/16/24 0846 98.3 F (36.8 C)     Temp Source 06/16/24 0846 Oral     SpO2 06/16/24 0846 100 %     Weight 06/16/24 0847 149 lb (67.6 kg)     Height 06/16/24 0847 5' 5.5 (1.664 m)     Head Circumference --      Peak Flow --      Pain Score 06/16/24 0847 6     Pain Loc --      Pain Education --      Exclude from Growth Chart --     Most recent vital signs: Vitals:   06/16/24 1300 06/16/24 1330  BP: (!) 168/97 (!) 165/90  Pulse: 72 71  Resp: 19 14  Temp:    SpO2: 100% 100%     General: Awake, interactive  CV:  Regular rate, good peripheral perfusion.  Resp:  Unlabored respirations lungs clear to auscultation,  Abd:  Nondistended, soft, nontender Neuro:  Alert and oriented, normal extraocular movements, symmetric facial movement, sensation intact over bilateral upper and lower extremities with 5 out of 5 strength.  Normal finger-to-nose testing.  Reproducible vertiginous symptoms with eye and head movements.   ED Results /  Procedures / Treatments   Labs (all labs ordered are listed, but only abnormal results are displayed) Labs Reviewed  COMPREHENSIVE METABOLIC PANEL WITH GFR - Abnormal; Notable for the following components:      Result Value   Alkaline Phosphatase 37 (*)    Total Bilirubin 1.4 (*)    All other components within normal limits  CBC  CBG MONITORING, ED     EKG EKG independently reviewed and interpreted by myself demonstrates:  EKG demonstrate sinus rhythm at a rate 70, PR 144, cures 78, QTc 414, no acute ST changes  RADIOLOGY Imaging independently reviewed and interpreted by myself demonstrates:   Formal Radiology Read:  No results found.  PROCEDURES:  Critical Care performed: No  Procedures   MEDICATIONS ORDERED IN ED: Medications  sodium chloride  0.9 % bolus 1,000 mL (0 mLs Intravenous Stopped 06/16/24 1355)  meclizine  (ANTIVERT ) tablet 25 mg (25 mg Oral Given 06/16/24 1238)  diazepam  (VALIUM ) tablet 5 mg (5 mg Oral Given 06/16/24 1402)     IMPRESSION / MDM / ASSESSMENT AND PLAN / ED COURSE  I  reviewed the triage vital signs and the nursing notes.  Differential diagnosis includes, but is not limited to, vertigo, likely peripheral based on clinical history and exam, no focal deficits, head trauma suggestive of intracranial process, consideration for anemia, electrolyte abnormality, arrhythmia  Patient's presentation is most consistent with acute presentation with potential threat to life or bodily function.  63 year old female presenting to the Emergency Department for evaluation of dizziness described as a spinning sensation.  Stable vitals on presentation.  Reassuring CBC, CMP.  EKG without acute ischemic findings.  Suspect likely peripheral vertigo based on patient's overall presentation.  Will trial meclizine  and IV fluids.  Clinical Course as of 06/16/24 1513  Wed Jun 16, 2024  1337 Patient reassessed and reports feeling improved, mild ongoing dizziness.  Will  ambulate patient and see how she tolerates this. [NR]    Clinical Course User Index [NR] Levander Slate, MD   Patient did have improvement but some recurrent dizziness when attempting to ambulate.  She was given a dose of Valium  and reassess.  Following this she reported significant improvement in her dizziness was able to ambulate with steady gait without significant symptoms.  She is comfortable with discharge home.  Discussed risks of benzodiazepines, will DC with short course given improvement with this as well as a prescription for meclizine .  Strict return precautions provided.  Patient discharged in stable condition.  FINAL CLINICAL IMPRESSION(S) / ED DIAGNOSES   Final diagnoses:  Vertigo     Rx / DC Orders   ED Discharge Orders          Ordered    meclizine  (ANTIVERT ) 25 MG tablet  3 times daily PRN        06/16/24 1512    diazepam  (VALIUM ) 5 MG tablet  Every 8 hours PRN        06/16/24 1512             Note:  This document was prepared using Dragon voice recognition software and may include unintentional dictation errors.   Levander Slate, MD 06/16/24 [phone]

## 2024-06-16 NOTE — ED Triage Notes (Signed)
 Patient to ED via POV for dizziness and headache. Ongoing since Monday night but worse today. States unable to walk due to the dizziness.

## 2024-06-23 DIAGNOSIS — R42 Dizziness and giddiness: Secondary | ICD-10-CM | POA: Diagnosis not present

## 2024-06-23 DIAGNOSIS — R2681 Unsteadiness on feet: Secondary | ICD-10-CM | POA: Diagnosis not present

## 2024-07-08 DIAGNOSIS — H903 Sensorineural hearing loss, bilateral: Secondary | ICD-10-CM | POA: Diagnosis not present

## 2024-07-08 DIAGNOSIS — H8111 Benign paroxysmal vertigo, right ear: Secondary | ICD-10-CM | POA: Diagnosis not present

## 2024-07-08 DIAGNOSIS — H9313 Tinnitus, bilateral: Secondary | ICD-10-CM | POA: Diagnosis not present

## 2024-07-21 DIAGNOSIS — H8111 Benign paroxysmal vertigo, right ear: Secondary | ICD-10-CM | POA: Diagnosis not present

## 2024-08-26 DIAGNOSIS — Z23 Encounter for immunization: Secondary | ICD-10-CM | POA: Diagnosis not present

## 2024-08-26 DIAGNOSIS — I10 Essential (primary) hypertension: Secondary | ICD-10-CM | POA: Diagnosis not present

## 2024-09-24 ENCOUNTER — Other Ambulatory Visit: Payer: Self-pay | Admitting: Obstetrics

## 2024-09-24 DIAGNOSIS — N951 Menopausal and female climacteric states: Secondary | ICD-10-CM
